# Patient Record
Sex: Male | Born: 1945 | Race: White | Hispanic: No | Marital: Married | Smoking: Current every day smoker
Health system: Southern US, Community
[De-identification: ages and names within clinical notes are randomized; demographics above are authoritative.]

## PROBLEM LIST (undated history)

## (undated) DIAGNOSIS — I10 Essential (primary) hypertension: Secondary | ICD-10-CM

## (undated) DIAGNOSIS — C221 Intrahepatic bile duct carcinoma: Principal | ICD-10-CM

## (undated) DIAGNOSIS — K7460 Unspecified cirrhosis of liver: Secondary | ICD-10-CM

## (undated) DIAGNOSIS — H269 Unspecified cataract: Secondary | ICD-10-CM

## (undated) DIAGNOSIS — R739 Hyperglycemia, unspecified: Secondary | ICD-10-CM

## (undated) DIAGNOSIS — T7840XA Allergy, unspecified, initial encounter: Secondary | ICD-10-CM

## (undated) DIAGNOSIS — J449 Chronic obstructive pulmonary disease, unspecified: Secondary | ICD-10-CM

## (undated) DIAGNOSIS — IMO0002 Reserved for concepts with insufficient information to code with codable children: Secondary | ICD-10-CM

## (undated) DIAGNOSIS — F1910 Other psychoactive substance abuse, uncomplicated: Secondary | ICD-10-CM

## (undated) HISTORY — DX: Intrahepatic bile duct carcinoma: C22.1

## (undated) HISTORY — PX: TONSILLECTOMY: SUR1361

## (undated) HISTORY — DX: Unspecified cataract: H26.9

## (undated) HISTORY — DX: Hyperglycemia, unspecified: R73.9

## (undated) HISTORY — DX: Unspecified cirrhosis of liver: K74.60

## (undated) HISTORY — DX: Allergy, unspecified, initial encounter: T78.40XA

## (undated) HISTORY — DX: Chronic obstructive pulmonary disease, unspecified: J44.9

## (undated) HISTORY — DX: Essential (primary) hypertension: I10

## (undated) HISTORY — DX: Reserved for concepts with insufficient information to code with codable children: IMO0002

## (undated) HISTORY — DX: Other psychoactive substance abuse, uncomplicated: F19.10

## (undated) HISTORY — PX: VASECTOMY: SHX75

---

## 2012-02-07 DIAGNOSIS — IMO0002 Reserved for concepts with insufficient information to code with codable children: Secondary | ICD-10-CM | POA: Diagnosis not present

## 2012-02-07 DIAGNOSIS — M999 Biomechanical lesion, unspecified: Secondary | ICD-10-CM | POA: Diagnosis not present

## 2012-02-07 DIAGNOSIS — S239XXA Sprain of unspecified parts of thorax, initial encounter: Secondary | ICD-10-CM | POA: Diagnosis not present

## 2012-02-07 DIAGNOSIS — S338XXA Sprain of other parts of lumbar spine and pelvis, initial encounter: Secondary | ICD-10-CM | POA: Diagnosis not present

## 2012-02-13 DIAGNOSIS — M999 Biomechanical lesion, unspecified: Secondary | ICD-10-CM | POA: Diagnosis not present

## 2012-02-13 DIAGNOSIS — IMO0002 Reserved for concepts with insufficient information to code with codable children: Secondary | ICD-10-CM | POA: Diagnosis not present

## 2012-02-13 DIAGNOSIS — S239XXA Sprain of unspecified parts of thorax, initial encounter: Secondary | ICD-10-CM | POA: Diagnosis not present

## 2012-02-13 DIAGNOSIS — S338XXA Sprain of other parts of lumbar spine and pelvis, initial encounter: Secondary | ICD-10-CM | POA: Diagnosis not present

## 2012-02-20 DIAGNOSIS — S239XXA Sprain of unspecified parts of thorax, initial encounter: Secondary | ICD-10-CM | POA: Diagnosis not present

## 2012-02-20 DIAGNOSIS — IMO0002 Reserved for concepts with insufficient information to code with codable children: Secondary | ICD-10-CM | POA: Diagnosis not present

## 2012-02-20 DIAGNOSIS — M999 Biomechanical lesion, unspecified: Secondary | ICD-10-CM | POA: Diagnosis not present

## 2012-02-20 DIAGNOSIS — S338XXA Sprain of other parts of lumbar spine and pelvis, initial encounter: Secondary | ICD-10-CM | POA: Diagnosis not present

## 2012-03-05 DIAGNOSIS — I739 Peripheral vascular disease, unspecified: Secondary | ICD-10-CM | POA: Diagnosis not present

## 2012-03-05 DIAGNOSIS — E291 Testicular hypofunction: Secondary | ICD-10-CM | POA: Diagnosis not present

## 2012-03-05 DIAGNOSIS — I10 Essential (primary) hypertension: Secondary | ICD-10-CM | POA: Diagnosis not present

## 2012-03-05 DIAGNOSIS — Z125 Encounter for screening for malignant neoplasm of prostate: Secondary | ICD-10-CM | POA: Diagnosis not present

## 2012-03-05 DIAGNOSIS — Z0000 Encounter for general adult medical examination without abnormal findings: Secondary | ICD-10-CM | POA: Diagnosis not present

## 2012-03-05 DIAGNOSIS — J449 Chronic obstructive pulmonary disease, unspecified: Secondary | ICD-10-CM | POA: Diagnosis not present

## 2012-03-12 DIAGNOSIS — M999 Biomechanical lesion, unspecified: Secondary | ICD-10-CM | POA: Diagnosis not present

## 2012-03-12 DIAGNOSIS — IMO0002 Reserved for concepts with insufficient information to code with codable children: Secondary | ICD-10-CM | POA: Diagnosis not present

## 2012-03-12 DIAGNOSIS — S239XXA Sprain of unspecified parts of thorax, initial encounter: Secondary | ICD-10-CM | POA: Diagnosis not present

## 2012-03-12 DIAGNOSIS — S338XXA Sprain of other parts of lumbar spine and pelvis, initial encounter: Secondary | ICD-10-CM | POA: Diagnosis not present

## 2012-04-09 DIAGNOSIS — M999 Biomechanical lesion, unspecified: Secondary | ICD-10-CM | POA: Diagnosis not present

## 2012-04-09 DIAGNOSIS — S239XXA Sprain of unspecified parts of thorax, initial encounter: Secondary | ICD-10-CM | POA: Diagnosis not present

## 2012-04-09 DIAGNOSIS — S338XXA Sprain of other parts of lumbar spine and pelvis, initial encounter: Secondary | ICD-10-CM | POA: Diagnosis not present

## 2012-04-09 DIAGNOSIS — IMO0002 Reserved for concepts with insufficient information to code with codable children: Secondary | ICD-10-CM | POA: Diagnosis not present

## 2012-05-07 DIAGNOSIS — IMO0002 Reserved for concepts with insufficient information to code with codable children: Secondary | ICD-10-CM | POA: Diagnosis not present

## 2012-05-07 DIAGNOSIS — S338XXA Sprain of other parts of lumbar spine and pelvis, initial encounter: Secondary | ICD-10-CM | POA: Diagnosis not present

## 2012-05-07 DIAGNOSIS — S239XXA Sprain of unspecified parts of thorax, initial encounter: Secondary | ICD-10-CM | POA: Diagnosis not present

## 2012-05-07 DIAGNOSIS — M999 Biomechanical lesion, unspecified: Secondary | ICD-10-CM | POA: Diagnosis not present

## 2012-06-05 DIAGNOSIS — S239XXA Sprain of unspecified parts of thorax, initial encounter: Secondary | ICD-10-CM | POA: Diagnosis not present

## 2012-06-05 DIAGNOSIS — S338XXA Sprain of other parts of lumbar spine and pelvis, initial encounter: Secondary | ICD-10-CM | POA: Diagnosis not present

## 2012-06-05 DIAGNOSIS — IMO0002 Reserved for concepts with insufficient information to code with codable children: Secondary | ICD-10-CM | POA: Diagnosis not present

## 2012-06-05 DIAGNOSIS — M999 Biomechanical lesion, unspecified: Secondary | ICD-10-CM | POA: Diagnosis not present

## 2012-06-27 DIAGNOSIS — Z23 Encounter for immunization: Secondary | ICD-10-CM | POA: Diagnosis not present

## 2012-07-03 DIAGNOSIS — S239XXA Sprain of unspecified parts of thorax, initial encounter: Secondary | ICD-10-CM | POA: Diagnosis not present

## 2012-07-03 DIAGNOSIS — M999 Biomechanical lesion, unspecified: Secondary | ICD-10-CM | POA: Diagnosis not present

## 2012-07-03 DIAGNOSIS — S338XXA Sprain of other parts of lumbar spine and pelvis, initial encounter: Secondary | ICD-10-CM | POA: Diagnosis not present

## 2012-07-03 DIAGNOSIS — IMO0002 Reserved for concepts with insufficient information to code with codable children: Secondary | ICD-10-CM | POA: Diagnosis not present

## 2012-08-14 DIAGNOSIS — IMO0002 Reserved for concepts with insufficient information to code with codable children: Secondary | ICD-10-CM | POA: Diagnosis not present

## 2012-08-14 DIAGNOSIS — M999 Biomechanical lesion, unspecified: Secondary | ICD-10-CM | POA: Diagnosis not present

## 2012-08-14 DIAGNOSIS — S338XXA Sprain of other parts of lumbar spine and pelvis, initial encounter: Secondary | ICD-10-CM | POA: Diagnosis not present

## 2012-08-14 DIAGNOSIS — S239XXA Sprain of unspecified parts of thorax, initial encounter: Secondary | ICD-10-CM | POA: Diagnosis not present

## 2012-08-16 DIAGNOSIS — S239XXA Sprain of unspecified parts of thorax, initial encounter: Secondary | ICD-10-CM | POA: Diagnosis not present

## 2012-08-16 DIAGNOSIS — S338XXA Sprain of other parts of lumbar spine and pelvis, initial encounter: Secondary | ICD-10-CM | POA: Diagnosis not present

## 2012-08-16 DIAGNOSIS — IMO0002 Reserved for concepts with insufficient information to code with codable children: Secondary | ICD-10-CM | POA: Diagnosis not present

## 2012-08-16 DIAGNOSIS — M999 Biomechanical lesion, unspecified: Secondary | ICD-10-CM | POA: Diagnosis not present

## 2012-08-19 DIAGNOSIS — M999 Biomechanical lesion, unspecified: Secondary | ICD-10-CM | POA: Diagnosis not present

## 2012-08-19 DIAGNOSIS — IMO0002 Reserved for concepts with insufficient information to code with codable children: Secondary | ICD-10-CM | POA: Diagnosis not present

## 2012-08-19 DIAGNOSIS — S239XXA Sprain of unspecified parts of thorax, initial encounter: Secondary | ICD-10-CM | POA: Diagnosis not present

## 2012-08-19 DIAGNOSIS — S338XXA Sprain of other parts of lumbar spine and pelvis, initial encounter: Secondary | ICD-10-CM | POA: Diagnosis not present

## 2012-08-21 DIAGNOSIS — S239XXA Sprain of unspecified parts of thorax, initial encounter: Secondary | ICD-10-CM | POA: Diagnosis not present

## 2012-08-21 DIAGNOSIS — IMO0002 Reserved for concepts with insufficient information to code with codable children: Secondary | ICD-10-CM | POA: Diagnosis not present

## 2012-08-21 DIAGNOSIS — M999 Biomechanical lesion, unspecified: Secondary | ICD-10-CM | POA: Diagnosis not present

## 2012-08-21 DIAGNOSIS — S338XXA Sprain of other parts of lumbar spine and pelvis, initial encounter: Secondary | ICD-10-CM | POA: Diagnosis not present

## 2012-08-22 DIAGNOSIS — M999 Biomechanical lesion, unspecified: Secondary | ICD-10-CM | POA: Diagnosis not present

## 2012-08-22 DIAGNOSIS — IMO0002 Reserved for concepts with insufficient information to code with codable children: Secondary | ICD-10-CM | POA: Diagnosis not present

## 2012-08-22 DIAGNOSIS — S239XXA Sprain of unspecified parts of thorax, initial encounter: Secondary | ICD-10-CM | POA: Diagnosis not present

## 2012-08-22 DIAGNOSIS — S338XXA Sprain of other parts of lumbar spine and pelvis, initial encounter: Secondary | ICD-10-CM | POA: Diagnosis not present

## 2012-08-25 DIAGNOSIS — J019 Acute sinusitis, unspecified: Secondary | ICD-10-CM | POA: Diagnosis not present

## 2012-08-26 DIAGNOSIS — M999 Biomechanical lesion, unspecified: Secondary | ICD-10-CM | POA: Diagnosis not present

## 2012-08-26 DIAGNOSIS — S239XXA Sprain of unspecified parts of thorax, initial encounter: Secondary | ICD-10-CM | POA: Diagnosis not present

## 2012-08-26 DIAGNOSIS — IMO0002 Reserved for concepts with insufficient information to code with codable children: Secondary | ICD-10-CM | POA: Diagnosis not present

## 2012-08-26 DIAGNOSIS — S338XXA Sprain of other parts of lumbar spine and pelvis, initial encounter: Secondary | ICD-10-CM | POA: Diagnosis not present

## 2012-08-28 DIAGNOSIS — S239XXA Sprain of unspecified parts of thorax, initial encounter: Secondary | ICD-10-CM | POA: Diagnosis not present

## 2012-08-28 DIAGNOSIS — IMO0002 Reserved for concepts with insufficient information to code with codable children: Secondary | ICD-10-CM | POA: Diagnosis not present

## 2012-08-28 DIAGNOSIS — S338XXA Sprain of other parts of lumbar spine and pelvis, initial encounter: Secondary | ICD-10-CM | POA: Diagnosis not present

## 2012-08-28 DIAGNOSIS — M999 Biomechanical lesion, unspecified: Secondary | ICD-10-CM | POA: Diagnosis not present

## 2012-08-29 DIAGNOSIS — IMO0002 Reserved for concepts with insufficient information to code with codable children: Secondary | ICD-10-CM | POA: Diagnosis not present

## 2012-08-29 DIAGNOSIS — M999 Biomechanical lesion, unspecified: Secondary | ICD-10-CM | POA: Diagnosis not present

## 2012-08-29 DIAGNOSIS — S338XXA Sprain of other parts of lumbar spine and pelvis, initial encounter: Secondary | ICD-10-CM | POA: Diagnosis not present

## 2012-08-29 DIAGNOSIS — S239XXA Sprain of unspecified parts of thorax, initial encounter: Secondary | ICD-10-CM | POA: Diagnosis not present

## 2012-09-02 DIAGNOSIS — M999 Biomechanical lesion, unspecified: Secondary | ICD-10-CM | POA: Diagnosis not present

## 2012-09-02 DIAGNOSIS — S239XXA Sprain of unspecified parts of thorax, initial encounter: Secondary | ICD-10-CM | POA: Diagnosis not present

## 2012-09-02 DIAGNOSIS — IMO0002 Reserved for concepts with insufficient information to code with codable children: Secondary | ICD-10-CM | POA: Diagnosis not present

## 2012-09-02 DIAGNOSIS — S338XXA Sprain of other parts of lumbar spine and pelvis, initial encounter: Secondary | ICD-10-CM | POA: Diagnosis not present

## 2012-09-03 DIAGNOSIS — L723 Sebaceous cyst: Secondary | ICD-10-CM | POA: Diagnosis not present

## 2012-09-03 DIAGNOSIS — E7800 Pure hypercholesterolemia, unspecified: Secondary | ICD-10-CM | POA: Diagnosis not present

## 2012-09-03 DIAGNOSIS — I10 Essential (primary) hypertension: Secondary | ICD-10-CM | POA: Diagnosis not present

## 2012-09-03 DIAGNOSIS — J449 Chronic obstructive pulmonary disease, unspecified: Secondary | ICD-10-CM | POA: Diagnosis not present

## 2012-09-04 DIAGNOSIS — IMO0002 Reserved for concepts with insufficient information to code with codable children: Secondary | ICD-10-CM | POA: Diagnosis not present

## 2012-09-04 DIAGNOSIS — S338XXA Sprain of other parts of lumbar spine and pelvis, initial encounter: Secondary | ICD-10-CM | POA: Diagnosis not present

## 2012-09-04 DIAGNOSIS — S239XXA Sprain of unspecified parts of thorax, initial encounter: Secondary | ICD-10-CM | POA: Diagnosis not present

## 2012-09-04 DIAGNOSIS — M999 Biomechanical lesion, unspecified: Secondary | ICD-10-CM | POA: Diagnosis not present

## 2012-09-09 DIAGNOSIS — M999 Biomechanical lesion, unspecified: Secondary | ICD-10-CM | POA: Diagnosis not present

## 2012-09-09 DIAGNOSIS — S338XXA Sprain of other parts of lumbar spine and pelvis, initial encounter: Secondary | ICD-10-CM | POA: Diagnosis not present

## 2012-09-09 DIAGNOSIS — S239XXA Sprain of unspecified parts of thorax, initial encounter: Secondary | ICD-10-CM | POA: Diagnosis not present

## 2012-09-09 DIAGNOSIS — IMO0002 Reserved for concepts with insufficient information to code with codable children: Secondary | ICD-10-CM | POA: Diagnosis not present

## 2012-09-11 ENCOUNTER — Ambulatory Visit: Payer: Self-pay | Admitting: Family Medicine

## 2012-09-11 DIAGNOSIS — M5020 Other cervical disc displacement, unspecified cervical region: Secondary | ICD-10-CM | POA: Diagnosis not present

## 2012-09-11 DIAGNOSIS — M4802 Spinal stenosis, cervical region: Secondary | ICD-10-CM | POA: Diagnosis not present

## 2012-09-11 DIAGNOSIS — M79609 Pain in unspecified limb: Secondary | ICD-10-CM | POA: Diagnosis not present

## 2012-10-07 DIAGNOSIS — M531 Cervicobrachial syndrome: Secondary | ICD-10-CM | POA: Diagnosis not present

## 2012-10-21 DIAGNOSIS — M531 Cervicobrachial syndrome: Secondary | ICD-10-CM | POA: Diagnosis not present

## 2012-12-26 DIAGNOSIS — H2589 Other age-related cataract: Secondary | ICD-10-CM | POA: Diagnosis not present

## 2012-12-30 DIAGNOSIS — J320 Chronic maxillary sinusitis: Secondary | ICD-10-CM | POA: Diagnosis not present

## 2013-01-13 DIAGNOSIS — IMO0002 Reserved for concepts with insufficient information to code with codable children: Secondary | ICD-10-CM | POA: Diagnosis not present

## 2013-01-13 DIAGNOSIS — S338XXA Sprain of other parts of lumbar spine and pelvis, initial encounter: Secondary | ICD-10-CM | POA: Diagnosis not present

## 2013-01-13 DIAGNOSIS — M999 Biomechanical lesion, unspecified: Secondary | ICD-10-CM | POA: Diagnosis not present

## 2013-01-13 DIAGNOSIS — S239XXA Sprain of unspecified parts of thorax, initial encounter: Secondary | ICD-10-CM | POA: Diagnosis not present

## 2013-01-16 DIAGNOSIS — S239XXA Sprain of unspecified parts of thorax, initial encounter: Secondary | ICD-10-CM | POA: Diagnosis not present

## 2013-01-16 DIAGNOSIS — S338XXA Sprain of other parts of lumbar spine and pelvis, initial encounter: Secondary | ICD-10-CM | POA: Diagnosis not present

## 2013-01-16 DIAGNOSIS — IMO0002 Reserved for concepts with insufficient information to code with codable children: Secondary | ICD-10-CM | POA: Diagnosis not present

## 2013-01-16 DIAGNOSIS — M999 Biomechanical lesion, unspecified: Secondary | ICD-10-CM | POA: Diagnosis not present

## 2013-01-20 DIAGNOSIS — S239XXA Sprain of unspecified parts of thorax, initial encounter: Secondary | ICD-10-CM | POA: Diagnosis not present

## 2013-01-20 DIAGNOSIS — M999 Biomechanical lesion, unspecified: Secondary | ICD-10-CM | POA: Diagnosis not present

## 2013-01-20 DIAGNOSIS — S338XXA Sprain of other parts of lumbar spine and pelvis, initial encounter: Secondary | ICD-10-CM | POA: Diagnosis not present

## 2013-01-20 DIAGNOSIS — IMO0002 Reserved for concepts with insufficient information to code with codable children: Secondary | ICD-10-CM | POA: Diagnosis not present

## 2013-02-20 DIAGNOSIS — H903 Sensorineural hearing loss, bilateral: Secondary | ICD-10-CM | POA: Diagnosis not present

## 2013-02-20 DIAGNOSIS — H6120 Impacted cerumen, unspecified ear: Secondary | ICD-10-CM | POA: Diagnosis not present

## 2013-03-04 DIAGNOSIS — I10 Essential (primary) hypertension: Secondary | ICD-10-CM | POA: Diagnosis not present

## 2013-03-04 DIAGNOSIS — R7301 Impaired fasting glucose: Secondary | ICD-10-CM | POA: Diagnosis not present

## 2013-03-31 DIAGNOSIS — I10 Essential (primary) hypertension: Secondary | ICD-10-CM | POA: Diagnosis not present

## 2013-04-30 DIAGNOSIS — Z0000 Encounter for general adult medical examination without abnormal findings: Secondary | ICD-10-CM | POA: Diagnosis not present

## 2013-04-30 DIAGNOSIS — Z125 Encounter for screening for malignant neoplasm of prostate: Secondary | ICD-10-CM | POA: Diagnosis not present

## 2013-04-30 DIAGNOSIS — I10 Essential (primary) hypertension: Secondary | ICD-10-CM | POA: Diagnosis not present

## 2013-04-30 DIAGNOSIS — J449 Chronic obstructive pulmonary disease, unspecified: Secondary | ICD-10-CM | POA: Diagnosis not present

## 2013-05-29 DIAGNOSIS — M62838 Other muscle spasm: Secondary | ICD-10-CM | POA: Diagnosis not present

## 2013-05-29 DIAGNOSIS — M9981 Other biomechanical lesions of cervical region: Secondary | ICD-10-CM | POA: Diagnosis not present

## 2013-05-29 DIAGNOSIS — M5412 Radiculopathy, cervical region: Secondary | ICD-10-CM | POA: Diagnosis not present

## 2013-05-29 DIAGNOSIS — M999 Biomechanical lesion, unspecified: Secondary | ICD-10-CM | POA: Diagnosis not present

## 2013-09-11 DIAGNOSIS — M999 Biomechanical lesion, unspecified: Secondary | ICD-10-CM | POA: Diagnosis not present

## 2013-09-11 DIAGNOSIS — M9981 Other biomechanical lesions of cervical region: Secondary | ICD-10-CM | POA: Diagnosis not present

## 2013-09-11 DIAGNOSIS — M5412 Radiculopathy, cervical region: Secondary | ICD-10-CM | POA: Diagnosis not present

## 2013-09-11 DIAGNOSIS — M62838 Other muscle spasm: Secondary | ICD-10-CM | POA: Diagnosis not present

## 2013-09-30 DIAGNOSIS — I10 Essential (primary) hypertension: Secondary | ICD-10-CM | POA: Diagnosis not present

## 2013-09-30 DIAGNOSIS — E119 Type 2 diabetes mellitus without complications: Secondary | ICD-10-CM | POA: Diagnosis not present

## 2013-09-30 DIAGNOSIS — L723 Sebaceous cyst: Secondary | ICD-10-CM | POA: Diagnosis not present

## 2013-09-30 DIAGNOSIS — R7309 Other abnormal glucose: Secondary | ICD-10-CM | POA: Diagnosis not present

## 2013-11-11 DIAGNOSIS — J40 Bronchitis, not specified as acute or chronic: Secondary | ICD-10-CM | POA: Diagnosis not present

## 2013-11-20 DIAGNOSIS — M999 Biomechanical lesion, unspecified: Secondary | ICD-10-CM | POA: Diagnosis not present

## 2013-11-20 DIAGNOSIS — M62838 Other muscle spasm: Secondary | ICD-10-CM | POA: Diagnosis not present

## 2013-11-20 DIAGNOSIS — M9981 Other biomechanical lesions of cervical region: Secondary | ICD-10-CM | POA: Diagnosis not present

## 2013-11-20 DIAGNOSIS — M5412 Radiculopathy, cervical region: Secondary | ICD-10-CM | POA: Diagnosis not present

## 2014-03-16 DIAGNOSIS — M9981 Other biomechanical lesions of cervical region: Secondary | ICD-10-CM | POA: Diagnosis not present

## 2014-03-16 DIAGNOSIS — M62838 Other muscle spasm: Secondary | ICD-10-CM | POA: Diagnosis not present

## 2014-03-16 DIAGNOSIS — M5412 Radiculopathy, cervical region: Secondary | ICD-10-CM | POA: Diagnosis not present

## 2014-03-16 DIAGNOSIS — M999 Biomechanical lesion, unspecified: Secondary | ICD-10-CM | POA: Diagnosis not present

## 2014-05-07 DIAGNOSIS — J449 Chronic obstructive pulmonary disease, unspecified: Secondary | ICD-10-CM | POA: Diagnosis not present

## 2014-05-07 DIAGNOSIS — Z125 Encounter for screening for malignant neoplasm of prostate: Secondary | ICD-10-CM | POA: Diagnosis not present

## 2014-05-07 DIAGNOSIS — R7309 Other abnormal glucose: Secondary | ICD-10-CM | POA: Diagnosis not present

## 2014-05-07 DIAGNOSIS — Z23 Encounter for immunization: Secondary | ICD-10-CM | POA: Diagnosis not present

## 2014-05-07 DIAGNOSIS — Z0000 Encounter for general adult medical examination without abnormal findings: Secondary | ICD-10-CM | POA: Diagnosis not present

## 2014-05-07 DIAGNOSIS — I10 Essential (primary) hypertension: Secondary | ICD-10-CM | POA: Diagnosis not present

## 2014-05-07 DIAGNOSIS — J309 Allergic rhinitis, unspecified: Secondary | ICD-10-CM | POA: Diagnosis not present

## 2014-05-18 DIAGNOSIS — H3552 Pigmentary retinal dystrophy: Secondary | ICD-10-CM | POA: Diagnosis not present

## 2014-05-18 DIAGNOSIS — H2510 Age-related nuclear cataract, unspecified eye: Secondary | ICD-10-CM | POA: Diagnosis not present

## 2014-05-25 DIAGNOSIS — M9981 Other biomechanical lesions of cervical region: Secondary | ICD-10-CM | POA: Diagnosis not present

## 2014-05-25 DIAGNOSIS — M5412 Radiculopathy, cervical region: Secondary | ICD-10-CM | POA: Diagnosis not present

## 2014-05-25 DIAGNOSIS — M999 Biomechanical lesion, unspecified: Secondary | ICD-10-CM | POA: Diagnosis not present

## 2014-05-25 DIAGNOSIS — M62838 Other muscle spasm: Secondary | ICD-10-CM | POA: Diagnosis not present

## 2014-06-01 DIAGNOSIS — R7989 Other specified abnormal findings of blood chemistry: Secondary | ICD-10-CM | POA: Diagnosis not present

## 2014-08-13 DIAGNOSIS — M62830 Muscle spasm of back: Secondary | ICD-10-CM | POA: Diagnosis not present

## 2014-08-13 DIAGNOSIS — M9901 Segmental and somatic dysfunction of cervical region: Secondary | ICD-10-CM | POA: Diagnosis not present

## 2014-08-13 DIAGNOSIS — M5412 Radiculopathy, cervical region: Secondary | ICD-10-CM | POA: Diagnosis not present

## 2014-08-13 DIAGNOSIS — M9902 Segmental and somatic dysfunction of thoracic region: Secondary | ICD-10-CM | POA: Diagnosis not present

## 2014-12-03 DIAGNOSIS — M9901 Segmental and somatic dysfunction of cervical region: Secondary | ICD-10-CM | POA: Diagnosis not present

## 2014-12-03 DIAGNOSIS — M5412 Radiculopathy, cervical region: Secondary | ICD-10-CM | POA: Diagnosis not present

## 2014-12-03 DIAGNOSIS — M62830 Muscle spasm of back: Secondary | ICD-10-CM | POA: Diagnosis not present

## 2014-12-03 DIAGNOSIS — M9902 Segmental and somatic dysfunction of thoracic region: Secondary | ICD-10-CM | POA: Diagnosis not present

## 2015-03-19 DIAGNOSIS — M5412 Radiculopathy, cervical region: Secondary | ICD-10-CM | POA: Diagnosis not present

## 2015-03-19 DIAGNOSIS — M9902 Segmental and somatic dysfunction of thoracic region: Secondary | ICD-10-CM | POA: Diagnosis not present

## 2015-03-19 DIAGNOSIS — M9901 Segmental and somatic dysfunction of cervical region: Secondary | ICD-10-CM | POA: Diagnosis not present

## 2015-03-19 DIAGNOSIS — M62830 Muscle spasm of back: Secondary | ICD-10-CM | POA: Diagnosis not present

## 2015-05-10 ENCOUNTER — Encounter: Payer: Self-pay | Admitting: Unknown Physician Specialty

## 2015-05-31 DIAGNOSIS — I10 Essential (primary) hypertension: Secondary | ICD-10-CM | POA: Insufficient documentation

## 2015-05-31 DIAGNOSIS — J449 Chronic obstructive pulmonary disease, unspecified: Secondary | ICD-10-CM | POA: Insufficient documentation

## 2015-06-01 ENCOUNTER — Encounter: Payer: Self-pay | Admitting: Family Medicine

## 2015-06-01 ENCOUNTER — Ambulatory Visit (INDEPENDENT_AMBULATORY_CARE_PROVIDER_SITE_OTHER): Payer: Medicare Other | Admitting: Family Medicine

## 2015-06-01 VITALS — BP 138/71 | HR 69 | Temp 98.9°F | Ht 68.3 in | Wt 194.0 lb

## 2015-06-01 DIAGNOSIS — R748 Abnormal levels of other serum enzymes: Secondary | ICD-10-CM | POA: Diagnosis not present

## 2015-06-01 DIAGNOSIS — I10 Essential (primary) hypertension: Secondary | ICD-10-CM

## 2015-06-01 DIAGNOSIS — Z0000 Encounter for general adult medical examination without abnormal findings: Secondary | ICD-10-CM

## 2015-06-01 DIAGNOSIS — J449 Chronic obstructive pulmonary disease, unspecified: Secondary | ICD-10-CM | POA: Diagnosis not present

## 2015-06-01 LAB — URINALYSIS, ROUTINE W REFLEX MICROSCOPIC
Bilirubin, UA: NEGATIVE
Glucose, UA: NEGATIVE
KETONES UA: NEGATIVE
Leukocytes, UA: NEGATIVE
NITRITE UA: NEGATIVE
Protein, UA: NEGATIVE
RBC UA: NEGATIVE
SPEC GRAV UA: 1.015 (ref 1.005–1.030)
UUROB: 1 mg/dL (ref 0.2–1.0)
pH, UA: 7 (ref 5.0–7.5)

## 2015-06-01 MED ORDER — HYDROCHLOROTHIAZIDE 25 MG PO TABS: 25.0000 mg | ORAL_TABLET | Freq: Every day | ORAL | Status: DC

## 2015-06-01 MED ORDER — AMLODIPINE BESYLATE 10 MG PO TABS
10.0000 mg | ORAL_TABLET | Freq: Every day | ORAL | Status: DC
Start: 2015-06-01 — End: 2016-06-17

## 2015-06-01 MED ORDER — TIOTROPIUM BROMIDE MONOHYDRATE 18 MCG IN CAPS: 18.0000 ug | ORAL_CAPSULE | Freq: Every day | RESPIRATORY_TRACT | Status: DC

## 2015-06-01 MED ORDER — CLONIDINE HCL 0.1 MG PO TABS: 0.1000 mg | ORAL_TABLET | Freq: Two times a day (BID) | ORAL | Status: DC

## 2015-06-01 MED ORDER — LOSARTAN POTASSIUM 100 MG PO TABS: 100.0000 mg | ORAL_TABLET | Freq: Every day | ORAL | Status: DC

## 2015-06-01 MED ORDER — SILDENAFIL CITRATE 100 MG PO TABS: 100.0000 mg | ORAL_TABLET | Freq: Every day | ORAL | Status: DC | PRN

## 2015-06-01 MED ORDER — CARVEDILOL 12.5 MG PO TABS: 12.5000 mg | ORAL_TABLET | Freq: Two times a day (BID) | ORAL | Status: DC

## 2015-06-01 NOTE — Assessment & Plan Note (Signed)
The current medical regimen is effective;  continue present plan and medications.  

## 2015-06-01 NOTE — Progress Notes (Signed)
BP 138/71 mmHg  Pulse 69  Temp(Src) 98.9 F (37.2 C)  Ht 5' 8.3" (1.735 m)  Wt 194 lb (87.998 kg)  BMI 29.23 kg/m2  SpO2 93%   Subjective:    Patient ID: Richard Whitehead, male    DOB: 09/28/1946, 69 y.o.   MRN: [id number]  HPI: Richard Whitehead is a 69 y.o. male  Chief Complaint  Patient presents with  . Annual Exam  . Foot Lesion   patient doing well on that lesion with small corn on his left bunionette area corn is been present at least a year or more Breathing doing okay concerned about cost of Spiriva Blood pressure doing well taking medications without side effects or problems. Having good control Change in medication from last visit is saving 100s of dollars.   Relevant past medical, surgical, family and social history reviewed and updated as indicated. Interim medical history since our last visit reviewed. Allergies and medications reviewed and updated.  Review of Systems  Constitutional: Negative.   HENT: Negative.   Eyes: Negative.   Respiratory: Negative.   Cardiovascular: Negative.   Gastrointestinal:       Occasional dysphasia drinking water clears it up or coughing Not getting worse.   Endocrine: Negative.   Musculoskeletal: Negative.   Skin: Negative.   Allergic/Immunologic: Negative.   Neurological: Negative.   Hematological: Negative.   Psychiatric/Behavioral: Negative.     Per HPI unless specifically indicated above     Objective:    BP 138/71 mmHg  Pulse 69  Temp(Src) 98.9 F (37.2 C)  Ht 5' 8.3" (1.735 m)  Wt 194 lb (87.998 kg)  BMI 29.23 kg/m2  SpO2 93%  Wt Readings from Last 3 Encounters:  06/01/15 194 lb (87.998 kg)  05/07/14 200 lb (90.719 kg)    Physical Exam  Constitutional: He is oriented to person, place, and time. He appears well-developed and well-nourished.  HENT:  Head: Normocephalic and atraumatic.  Right Ear: External ear normal.  Left Ear: External ear normal.  Eyes: Conjunctivae and EOM are normal. Pupils are  equal, round, and reactive to light.  Neck: Normal range of motion. Neck supple.  Cardiovascular: Normal rate, regular rhythm, normal heart sounds and intact distal pulses.   Pulmonary/Chest: Effort normal and breath sounds normal.  Abdominal: Soft. Bowel sounds are normal. There is no splenomegaly or hepatomegaly.  Genitourinary: Rectum normal, prostate normal and penis normal.  Musculoskeletal: Normal range of motion.  Corn on left foot pared with #15 blade down to normal skin 2cm size  Neurological: He is alert and oriented to person, place, and time. He has normal reflexes.  Skin: No rash noted. No erythema.  Psychiatric: He has a normal mood and affect. His behavior is normal. Judgment and thought content normal.    No results found for this or any previous visit.    Assessment & Plan:   Problem List Items Addressed This Visit      Cardiovascular and Mediastinum   Hypertension - Primary    The current medical regimen is effective;  continue present plan and medications.       Relevant Medications   amLODipine (NORVASC) 10 MG tablet   carvedilol (COREG) 12.5 MG tablet   cloNIDine (CATAPRES) 0.1 MG tablet   hydrochlorothiazide (HYDRODIURIL) 25 MG tablet   losartan (COZAAR) 100 MG tablet   sildenafil (VIAGRA) 100 MG tablet     Respiratory   COPD (chronic obstructive pulmonary disease)    The current medical regimen  is effective;  continue present plan and medications.       Relevant Medications   tiotropium (SPIRIVA) 18 MCG inhalation capsule    Other Visit Diagnoses    PE (physical exam), annual        Relevant Orders    Comprehensive metabolic panel    Lipid panel    CBC with Differential/Platelet    TSH    Urinalysis, Routine w reflex microscopic (not at [facility])    PSA        Follow up plan: Return in about 6 months (around 12/02/2015), or if symptoms worsen or fail to improve.

## 2015-06-02 DIAGNOSIS — R748 Abnormal levels of other serum enzymes: Secondary | ICD-10-CM | POA: Insufficient documentation

## 2015-06-02 LAB — CBC WITH DIFFERENTIAL/PLATELET
BASOS ABS: 0 10*3/uL (ref 0.0–0.2)
Basos: 0 %
EOS (ABSOLUTE): 0 10*3/uL (ref 0.0–0.4)
EOS: 1 %
HEMOGLOBIN: 17.4 g/dL (ref 12.6–17.7)
Hematocrit: 49.1 % (ref 37.5–51.0)
IMMATURE GRANS (ABS): 0 10*3/uL (ref 0.0–0.1)
Immature Granulocytes: 0 %
LYMPHS: 14 %
Lymphocytes Absolute: 1.1 10*3/uL (ref 0.7–3.1)
MCH: 36.2 pg — ABNORMAL HIGH (ref 26.6–33.0)
MCHC: 35.4 g/dL (ref 31.5–35.7)
MCV: 102 fL — ABNORMAL HIGH (ref 79–97)
MONOCYTES: 10 %
Monocytes Absolute: 0.8 10*3/uL (ref 0.1–0.9)
NEUTROS ABS: 5.7 10*3/uL (ref 1.4–7.0)
Neutrophils: 75 %
Platelets: 183 10*3/uL (ref 150–379)
RBC: 4.81 x10E6/uL (ref 4.14–5.80)
RDW: 12.8 % (ref 12.3–15.4)
WBC: 7.7 10*3/uL (ref 3.4–10.8)

## 2015-06-02 LAB — COMPREHENSIVE METABOLIC PANEL
A/G RATIO: 1 — AB (ref 1.1–2.5)
ALT: 39 IU/L (ref 0–44)
AST: 37 IU/L (ref 0–40)
Albumin: 3.7 g/dL (ref 3.5–4.8)
Alkaline Phosphatase: 193 IU/L — ABNORMAL HIGH (ref 39–117)
BILIRUBIN TOTAL: 1.4 mg/dL — AB (ref 0.0–1.2)
BUN/Creatinine Ratio: 14 (ref 10–22)
BUN: 11 mg/dL (ref 8–27)
CALCIUM: 10 mg/dL (ref 8.6–10.2)
CHLORIDE: 92 mmol/L — AB (ref 97–108)
CO2: 26 mmol/L (ref 18–29)
Creatinine, Ser: 0.81 mg/dL (ref 0.76–1.27)
GFR calc Af Amer: 103 mL/min/{1.73_m2} (ref >59)
GFR, EST NON AFRICAN AMERICAN: 89 mL/min/{1.73_m2} (ref >59)
GLOBULIN, TOTAL: 3.6 g/dL (ref 1.5–4.5)
Glucose: 133 mg/dL — ABNORMAL HIGH (ref 65–99)
POTASSIUM: 3.9 mmol/L (ref 3.5–5.2)
SODIUM: 133 mmol/L — AB (ref 134–144)
Total Protein: 7.3 g/dL (ref 6.0–8.5)

## 2015-06-02 LAB — LIPID PANEL
CHOL/HDL RATIO: 4 ratio (ref 0.0–5.0)
Cholesterol, Total: 190 mg/dL (ref 100–199)
HDL: 48 mg/dL (ref >39)
LDL Calculated: 122 mg/dL — ABNORMAL HIGH (ref 0–99)
Triglycerides: 99 mg/dL (ref 0–149)
VLDL Cholesterol Cal: 20 mg/dL (ref 5–40)

## 2015-06-02 LAB — TSH: TSH: 1.83 u[IU]/mL (ref 0.450–4.500)

## 2015-06-02 LAB — PSA: PROSTATE SPECIFIC AG, SERUM: 1.7 ng/mL (ref 0.0–4.0)

## 2015-06-02 NOTE — Addendum Note (Signed)
Addended byGolden Pop on: 06/02/2015 11:25 AM   Modules accepted: Orders, SmartSet

## 2015-06-02 NOTE — Assessment & Plan Note (Signed)
Discuss lab work showing elevated alkaline phosphatase patient drinking beer on a regular basis Will cut back on alcohol recheck CMP 1 month

## 2015-07-01 DIAGNOSIS — Z23 Encounter for immunization: Secondary | ICD-10-CM | POA: Diagnosis not present

## 2015-07-21 ENCOUNTER — Telehealth: Payer: Self-pay | Admitting: Family Medicine

## 2015-07-21 NOTE — Telephone Encounter (Signed)
Pt came in stated he would like his RX changed to generic version of Viagra (extra strength). Please call pt ASAP. Thanks

## 2015-07-22 MED ORDER — SILDENAFIL CITRATE 20 MG PO TABS: 20.0000 mg | ORAL_TABLET | Freq: Every day | ORAL | Status: DC | PRN

## 2015-07-22 NOTE — Telephone Encounter (Signed)
Viagra Rx with coupon is too expensive Patient would like an Rx for the generic (any strength)  He will pick up Rx tomorrow

## 2015-07-22 NOTE — Telephone Encounter (Signed)
rx

## 2015-09-24 DIAGNOSIS — J019 Acute sinusitis, unspecified: Secondary | ICD-10-CM | POA: Diagnosis not present

## 2015-11-17 DIAGNOSIS — H2513 Age-related nuclear cataract, bilateral: Secondary | ICD-10-CM | POA: Diagnosis not present

## 2015-12-02 ENCOUNTER — Ambulatory Visit (INDEPENDENT_AMBULATORY_CARE_PROVIDER_SITE_OTHER): Payer: Medicare Other | Admitting: Family Medicine

## 2015-12-02 ENCOUNTER — Encounter: Payer: Self-pay | Admitting: Family Medicine

## 2015-12-02 VITALS — BP 136/79 | HR 66 | Temp 97.6°F | Ht 68.1 in | Wt 193.0 lb

## 2015-12-02 DIAGNOSIS — Z113 Encounter for screening for infections with a predominantly sexual mode of transmission: Secondary | ICD-10-CM | POA: Diagnosis not present

## 2015-12-02 DIAGNOSIS — J449 Chronic obstructive pulmonary disease, unspecified: Secondary | ICD-10-CM

## 2015-12-02 DIAGNOSIS — I10 Essential (primary) hypertension: Secondary | ICD-10-CM

## 2015-12-02 NOTE — Assessment & Plan Note (Signed)
The current medical regimen is effective;  continue present plan and medications.  

## 2015-12-02 NOTE — Progress Notes (Signed)
BP 136/79 mmHg  Pulse 66  Temp(Src) 97.6 F (36.4 C)  Ht 5' 8.1" (1.73 m)  Wt 193 lb (87.544 kg)  BMI 29.25 kg/m2  SpO2 98%   Subjective:    Patient ID: Richard Whitehead, male    DOB: 1946-02-03, 71 y.o.   MRN: [id number]  HPI: Richard Whitehead is a 70 y.o. male  Chief Complaint  Patient presents with  . Hypertension  . COPD   Well with blood pressure good control at home no side effects from medications that may be some ankle edema that comes on during the day goes away overnight No real issues with edema  Breathing is stable continues to smoke but not as much continue Spiriva without problems  Relevant past medical, surgical, family and social history reviewed and updated as indicated. Interim medical history since our last visit reviewed. Allergies and medications reviewed and updated.  Review of Systems  Constitutional: Negative.   Respiratory: Negative.   Cardiovascular: Negative.     Per HPI unless specifically indicated above     Objective:    BP 136/79 mmHg  Pulse 66  Temp(Src) 97.6 F (36.4 C)  Ht 5' 8.1" (1.73 m)  Wt 193 lb (87.544 kg)  BMI 29.25 kg/m2  SpO2 98%  Wt Readings from Last 3 Encounters:  12/02/15 193 lb (87.544 kg)  06/01/15 194 lb (87.998 kg)  05/07/14 200 lb (90.719 kg)    Physical Exam  Constitutional: He is oriented to person, place, and time. He appears well-developed and well-nourished. No distress.  HENT:  Head: Normocephalic and atraumatic.  Right Ear: Hearing normal.  Left Ear: Hearing normal.  Nose: Nose normal.  Eyes: Conjunctivae and lids are normal. Right eye exhibits no discharge. Left eye exhibits no discharge. No scleral icterus.  Cardiovascular: Normal rate, regular rhythm and normal heart sounds.   Pulmonary/Chest: Effort normal and breath sounds normal. No respiratory distress.  Musculoskeletal: Normal range of motion.  Neurological: He is alert and oriented to person, place, and time.  Skin: Skin is intact. No  rash noted.  Psychiatric: He has a normal mood and affect. His speech is normal and behavior is normal. Judgment and thought content normal. Cognition and memory are normal.    Results for orders placed or performed in visit on 06/01/15  Comprehensive metabolic panel  Result Value Ref Range   Glucose 133 (H) 65 - 99 mg/dL   BUN 11 8 - 27 mg/dL   Creatinine, Ser 0.81 0.76 - 1.27 mg/dL   GFR calc non Af Amer 89 >59 mL/min/1.73   GFR calc Af Amer 103 >59 mL/min/1.73   BUN/Creatinine Ratio 14 10 - 22   Sodium 133 (L) 134 - 144 mmol/L   Potassium 3.9 3.5 - 5.2 mmol/L   Chloride 92 (L) 97 - 108 mmol/L   CO2 26 18 - 29 mmol/L   Calcium 10.0 8.6 - 10.2 mg/dL   Total Protein 7.3 6.0 - 8.5 g/dL   Albumin 3.7 3.5 - 4.8 g/dL   Globulin, Total 3.6 1.5 - 4.5 g/dL   Albumin/Globulin Ratio 1.0 (L) 1.1 - 2.5   Bilirubin Total 1.4 (H) 0.0 - 1.2 mg/dL   Alkaline Phosphatase 193 (H) 39 - 117 IU/L   AST 37 0 - 40 IU/L   ALT 39 0 - 44 IU/L  Lipid panel  Result Value Ref Range   Cholesterol, Total 190 100 - 199 mg/dL   Triglycerides 99 0 - 149 mg/dL   HDL  48 >39 mg/dL   VLDL Cholesterol Cal 20 5 - 40 mg/dL   LDL Calculated 122 (H) 0 - 99 mg/dL   Chol/HDL Ratio 4.0 0.0 - 5.0 ratio units  CBC with Differential/Platelet  Result Value Ref Range   WBC 7.7 3.4 - 10.8 x10E3/uL   RBC 4.81 4.14 - 5.80 x10E6/uL   Hemoglobin 17.4 12.6 - 17.7 g/dL   Hematocrit 49.1 37.5 - 51.0 %   MCV 102 (H) 79 - 97 fL   MCH 36.2 (H) 26.6 - 33.0 pg   MCHC 35.4 31.5 - 35.7 g/dL   RDW 12.8 12.3 - 15.4 %   Platelets 183 150 - 379 x10E3/uL   Neutrophils 75 %   Lymphs 14 %   Monocytes 10 %   Eos 1 %   Basos 0 %   Neutrophils Absolute 5.7 1.4 - 7.0 x10E3/uL   Lymphocytes Absolute 1.1 0.7 - 3.1 x10E3/uL   Monocytes Absolute 0.8 0.1 - 0.9 x10E3/uL   EOS (ABSOLUTE) 0.0 0.0 - 0.4 x10E3/uL   Basophils Absolute 0.0 0.0 - 0.2 x10E3/uL   Immature Granulocytes 0 %   Immature Grans (Abs) 0.0 0.0 - 0.1 x10E3/uL  TSH  Result  Value Ref Range   TSH 1.830 0.450 - 4.500 uIU/mL  Urinalysis, Routine w reflex microscopic (not at [facility])  Result Value Ref Range   Specific Gravity, UA 1.015 1.005 - 1.030   pH, UA 7.0 5.0 - 7.5   Color, UA Yellow Yellow   Appearance Ur Clear Clear   Leukocytes, UA Negative Negative   Protein, UA Negative Negative/Trace   Glucose, UA Negative Negative   Ketones, UA Negative Negative   RBC, UA Negative Negative   Bilirubin, UA Negative Negative   Urobilinogen, Ur 1.0 0.2 - 1.0 mg/dL   Nitrite, UA Negative Negative  PSA  Result Value Ref Range   Prostate Specific Ag, Serum 1.7 0.0 - 4.0 ng/mL      Assessment & Plan:   Problem List Items Addressed This Visit      Cardiovascular and Mediastinum   Hypertension    The current medical regimen is effective;  continue present plan and medications.         Respiratory   COPD (chronic obstructive pulmonary disease) (HCC)    The current medical regimen is effective;  continue present plan and medications.        Other Visit Diagnoses    Routine screening for STI (sexually transmitted infection)    -  Primary    Relevant Orders    Hepatitis C Antibody        Follow up plan: Return in about 6 months (around 05/31/2016) for Physical Exam.

## 2016-01-17 ENCOUNTER — Encounter: Payer: Self-pay | Admitting: Family Medicine

## 2016-01-17 ENCOUNTER — Ambulatory Visit (INDEPENDENT_AMBULATORY_CARE_PROVIDER_SITE_OTHER): Payer: Medicare Other | Admitting: Family Medicine

## 2016-01-17 VITALS — BP 133/79 | HR 70 | Temp 97.5°F | Ht 68.1 in | Wt 189.0 lb

## 2016-01-17 DIAGNOSIS — H6122 Impacted cerumen, left ear: Secondary | ICD-10-CM | POA: Diagnosis not present

## 2016-01-17 NOTE — Patient Instructions (Addendum)
DEBROX- put it in your ear and let it sit there for 15 minutes, then shake it out. Do this 1x per month to prevent getting an over buildup of wax.

## 2016-01-17 NOTE — Progress Notes (Signed)
BP 133/79 mmHg  Pulse 70  Temp(Src) 97.5 F (36.4 C)  Ht 5' 8.1" (1.73 m)  Wt 189 lb (85.73 kg)  BMI 28.64 kg/m2  SpO2 97%   Subjective:    Patient ID: Richard Whitehead, male    DOB: [birth year], 70 y.o.   MRN: [id number]  HPI: Richard Whitehead is a 70 y.o. male  Chief Complaint  Patient presents with  . ear clogged   EAG CLOGGED Duration: chronic Involved ear(s):  left Sensation of feeling clogged/plugged: yes Decreased/muffled hearing:yes Ear pain: no Fever: no Otorrhea: no Hearing loss: yes Upper respiratory infection symptoms: no Using Q-Tips: no Status: stable History of cerumenosis: yes Treatments attempted: tried to get his ears cleaned out by the audiologist, but she didn't have the tools to get all the wax out so recommended that he come here  Relevant past medical, surgical, family and social history reviewed and updated as indicated. Interim medical history since our last visit reviewed. Allergies and medications reviewed and updated.  Review of Systems  Constitutional: Negative.   HENT: Positive for hearing loss. Negative for congestion, dental problem, drooling, ear discharge, ear pain, facial swelling, mouth sores, nosebleeds, postnasal drip, rhinorrhea, sinus pressure, sneezing, sore throat, tinnitus, trouble swallowing and voice change.     Per HPI unless specifically indicated above     Objective:    BP 133/79 mmHg  Pulse 70  Temp(Src) 97.5 F (36.4 C)  Ht 5' 8.1" (1.73 m)  Wt 189 lb (85.73 kg)  BMI 28.64 kg/m2  SpO2 97%  Wt Readings from Last 3 Encounters:  01/17/16 189 lb (85.73 kg)  12/02/15 193 lb (87.544 kg)  06/01/15 194 lb (87.998 kg)    Physical Exam  Constitutional: He is oriented to person, place, and time. He appears well-developed and well-nourished. No distress.  HENT:  Head: Normocephalic and atraumatic.  Right Ear: Hearing, tympanic membrane, external ear and ear canal normal.  Left Ear: Hearing, tympanic membrane and external  ear normal.  Nose: Nose normal.  Mouth/Throat: Oropharynx is clear and moist. No oropharyngeal exudate.  Cerumen impaction deep in L ear, mild amount of dry wax left in L EAC after irrigation. TM clear   Eyes: Conjunctivae and lids are normal. Right eye exhibits no discharge. Left eye exhibits no discharge. No scleral icterus.  Pulmonary/Chest: Effort normal. No respiratory distress.  Musculoskeletal: Normal range of motion.  Neurological: He is alert and oriented to person, place, and time.  Skin: Skin is warm, dry and intact. No rash noted. No erythema. No pallor.  Psychiatric: He has a normal mood and affect. His speech is normal and behavior is normal. Judgment and thought content normal. Cognition and memory are normal.  Nursing note and vitals reviewed.   Results for orders placed or performed in visit on 06/01/15  Comprehensive metabolic panel  Result Value Ref Range   Glucose 133 (H) 65 - 99 mg/dL   BUN 11 8 - 27 mg/dL   Creatinine, Ser 0.81 0.76 - 1.27 mg/dL   GFR calc non Af Amer 89 >59 mL/min/1.73   GFR calc Af Amer 103 >59 mL/min/1.73   BUN/Creatinine Ratio 14 10 - 22   Sodium 133 (L) 134 - 144 mmol/L   Potassium 3.9 3.5 - 5.2 mmol/L   Chloride 92 (L) 97 - 108 mmol/L   CO2 26 18 - 29 mmol/L   Calcium 10.0 8.6 - 10.2 mg/dL   Total Protein 7.3 6.0 - 8.5 g/dL   Albumin 3.7  3.5 - 4.8 g/dL   Globulin, Total 3.6 1.5 - 4.5 g/dL   Albumin/Globulin Ratio 1.0 (L) 1.1 - 2.5   Bilirubin Total 1.4 (H) 0.0 - 1.2 mg/dL   Alkaline Phosphatase 193 (H) 39 - 117 IU/L   AST 37 0 - 40 IU/L   ALT 39 0 - 44 IU/L  Lipid panel  Result Value Ref Range   Cholesterol, Total 190 100 - 199 mg/dL   Triglycerides 99 0 - 149 mg/dL   HDL 48 >39 mg/dL   VLDL Cholesterol Cal 20 5 - 40 mg/dL   LDL Calculated 122 (H) 0 - 99 mg/dL   Chol/HDL Ratio 4.0 0.0 - 5.0 ratio units  CBC with Differential/Platelet  Result Value Ref Range   WBC 7.7 3.4 - 10.8 x10E3/uL   RBC 4.81 4.14 - 5.80 x10E6/uL    Hemoglobin 17.4 12.6 - 17.7 g/dL   Hematocrit 49.1 37.5 - 51.0 %   MCV 102 (H) 79 - 97 fL   MCH 36.2 (H) 26.6 - 33.0 pg   MCHC 35.4 31.5 - 35.7 g/dL   RDW 12.8 12.3 - 15.4 %   Platelets 183 150 - 379 x10E3/uL   Neutrophils 75 %   Lymphs 14 %   Monocytes 10 %   Eos 1 %   Basos 0 %   Neutrophils Absolute 5.7 1.4 - 7.0 x10E3/uL   Lymphocytes Absolute 1.1 0.7 - 3.1 x10E3/uL   Monocytes Absolute 0.8 0.1 - 0.9 x10E3/uL   EOS (ABSOLUTE) 0.0 0.0 - 0.4 x10E3/uL   Basophils Absolute 0.0 0.0 - 0.2 x10E3/uL   Immature Granulocytes 0 %   Immature Grans (Abs) 0.0 0.0 - 0.1 x10E3/uL  TSH  Result Value Ref Range   TSH 1.830 0.450 - 4.500 uIU/mL  Urinalysis, Routine w reflex microscopic (not at [facility])  Result Value Ref Range   Specific Gravity, UA 1.015 1.005 - 1.030   pH, UA 7.0 5.0 - 7.5   Color, UA Yellow Yellow   Appearance Ur Clear Clear   Leukocytes, UA Negative Negative   Protein, UA Negative Negative/Trace   Glucose, UA Negative Negative   Ketones, UA Negative Negative   RBC, UA Negative Negative   Bilirubin, UA Negative Negative   Urobilinogen, Ur 1.0 0.2 - 1.0 mg/dL   Nitrite, UA Negative Negative  PSA  Result Value Ref Range   Prostate Specific Ag, Serum 1.7 0.0 - 4.0 ng/mL      Assessment & Plan:   Problem List Items Addressed This Visit    None    Visit Diagnoses    Cerumen impaction, left    -  Primary    Ear irrigated with good results. Will use debrox monthly to prevent impaction. Call with any concerns.        Follow up plan: Return if symptoms worsen or fail to improve.

## 2016-02-02 DIAGNOSIS — H60332 Swimmer's ear, left ear: Secondary | ICD-10-CM | POA: Diagnosis not present

## 2016-02-02 DIAGNOSIS — H6122 Impacted cerumen, left ear: Secondary | ICD-10-CM | POA: Diagnosis not present

## 2016-02-07 DIAGNOSIS — S80819A Abrasion, unspecified lower leg, initial encounter: Secondary | ICD-10-CM | POA: Diagnosis not present

## 2016-02-14 DIAGNOSIS — H903 Sensorineural hearing loss, bilateral: Secondary | ICD-10-CM | POA: Diagnosis not present

## 2016-02-14 DIAGNOSIS — H60332 Swimmer's ear, left ear: Secondary | ICD-10-CM | POA: Diagnosis not present

## 2016-02-15 ENCOUNTER — Other Ambulatory Visit: Payer: Self-pay | Admitting: Family Medicine

## 2016-02-15 MED ORDER — TIOTROPIUM BROMIDE MONOHYDRATE 1.25 MCG/ACT IN AERS: 1.0000 | INHALATION_SPRAY | Freq: Every morning | RESPIRATORY_TRACT | Status: DC

## 2016-05-11 ENCOUNTER — Encounter: Payer: Self-pay | Admitting: Family Medicine

## 2016-05-11 ENCOUNTER — Ambulatory Visit (INDEPENDENT_AMBULATORY_CARE_PROVIDER_SITE_OTHER): Payer: Medicare Other | Admitting: Family Medicine

## 2016-05-11 VITALS — BP 147/78 | HR 75 | Temp 98.4°F | Ht 68.7 in | Wt 190.0 lb

## 2016-05-11 DIAGNOSIS — H6122 Impacted cerumen, left ear: Secondary | ICD-10-CM | POA: Diagnosis not present

## 2016-05-11 NOTE — Progress Notes (Signed)
BP (!) 147/78 (BP Location: Left Arm, Patient Position: Sitting, Cuff Size: Normal)   Pulse 75   Temp 98.4 F (36.9 C)   Ht 5' 8.7" (1.745 m)   Wt 190 lb (86.2 kg)   SpO2 99%   BMI 28.30 kg/m    Subjective:    Patient ID: Richard Whitehead, male    DOB: 1945/12/05, 70 y.o.   MRN: [id number]  HPI: Richard Whitehead is a 70 y.o. male  Chief Complaint  Patient presents with  . ear cleaning    left   Left ear stopped up and decreased hearing wants wax removed Relevant past medical, surgical, family and social history reviewed and updated as indicated. Interim medical history since our last visit reviewed. Allergies and medications reviewed and updated.  Review of Systems  Per HPI unless specifically indicated above     Objective:    BP (!) 147/78 (BP Location: Left Arm, Patient Position: Sitting, Cuff Size: Normal)   Pulse 75   Temp 98.4 F (36.9 C)   Ht 5' 8.7" (1.745 m)   Wt 190 lb (86.2 kg)   SpO2 99%   BMI 28.30 kg/m   Wt Readings from Last 3 Encounters:  05/11/16 190 lb (86.2 kg)  01/17/16 189 lb (85.7 kg)  12/02/15 193 lb (87.5 kg)    Physical Exam  Constitutional: He is oriented to person, place, and time. He appears well-developed and well-nourished. No distress.  HENT:  Head: Normocephalic and atraumatic.  Right Ear: Hearing and external ear normal.  Left Ear: Hearing normal.  Nose: Nose normal.  Left ear blocked with cerumen removed with water and instruments revealing normal canal and TM restoration of hearing.  Eyes: Conjunctivae and lids are normal. Right eye exhibits no discharge. Left eye exhibits no discharge. No scleral icterus.  Pulmonary/Chest: Effort normal. No respiratory distress.  Musculoskeletal: Normal range of motion.  Neurological: He is alert and oriented to person, place, and time.  Skin: Skin is intact. No rash noted.  Psychiatric: He has a normal mood and affect. His speech is normal and behavior is normal. Judgment and thought content  normal. Cognition and memory are normal.    Results for orders placed or performed in visit on 06/01/15  Comprehensive metabolic panel  Result Value Ref Range   Glucose 133 (H) 65 - 99 mg/dL   BUN 11 8 - 27 mg/dL   Creatinine, Ser 0.81 0.76 - 1.27 mg/dL   GFR calc non Af Amer 89 >59 mL/min/1.73   GFR calc Af Amer 103 >59 mL/min/1.73   BUN/Creatinine Ratio 14 10 - 22   Sodium 133 (L) 134 - 144 mmol/L   Potassium 3.9 3.5 - 5.2 mmol/L   Chloride 92 (L) 97 - 108 mmol/L   CO2 26 18 - 29 mmol/L   Calcium 10.0 8.6 - 10.2 mg/dL   Total Protein 7.3 6.0 - 8.5 g/dL   Albumin 3.7 3.5 - 4.8 g/dL   Globulin, Total 3.6 1.5 - 4.5 g/dL   Albumin/Globulin Ratio 1.0 (L) 1.1 - 2.5   Bilirubin Total 1.4 (H) 0.0 - 1.2 mg/dL   Alkaline Phosphatase 193 (H) 39 - 117 IU/L   AST 37 0 - 40 IU/L   ALT 39 0 - 44 IU/L  Lipid panel  Result Value Ref Range   Cholesterol, Total 190 100 - 199 mg/dL   Triglycerides 99 0 - 149 mg/dL   HDL 48 >39 mg/dL   VLDL Cholesterol Cal 20 5 -  40 mg/dL   LDL Calculated 122 (H) 0 - 99 mg/dL   Chol/HDL Ratio 4.0 0.0 - 5.0 ratio units  CBC with Differential/Platelet  Result Value Ref Range   WBC 7.7 3.4 - 10.8 x10E3/uL   RBC 4.81 4.14 - 5.80 x10E6/uL   Hemoglobin 17.4 12.6 - 17.7 g/dL   Hematocrit 49.1 37.5 - 51.0 %   MCV 102 (H) 79 - 97 fL   MCH 36.2 (H) 26.6 - 33.0 pg   MCHC 35.4 31.5 - 35.7 g/dL   RDW 12.8 12.3 - 15.4 %   Platelets 183 150 - 379 x10E3/uL   Neutrophils 75 %   Lymphs 14 %   Monocytes 10 %   Eos 1 %   Basos 0 %   Neutrophils Absolute 5.7 1.4 - 7.0 x10E3/uL   Lymphocytes Absolute 1.1 0.7 - 3.1 x10E3/uL   Monocytes Absolute 0.8 0.1 - 0.9 x10E3/uL   EOS (ABSOLUTE) 0.0 0.0 - 0.4 x10E3/uL   Basophils Absolute 0.0 0.0 - 0.2 x10E3/uL   Immature Granulocytes 0 %   Immature Grans (Abs) 0.0 0.0 - 0.1 x10E3/uL  TSH  Result Value Ref Range   TSH 1.830 0.450 - 4.500 uIU/mL  Urinalysis, Routine w reflex microscopic (not at [facility])  Result Value Ref Range    Specific Gravity, UA 1.015 1.005 - 1.030   pH, UA 7.0 5.0 - 7.5   Color, UA Yellow Yellow   Appearance Ur Clear Clear   Leukocytes, UA Negative Negative   Protein, UA Negative Negative/Trace   Glucose, UA Negative Negative   Ketones, UA Negative Negative   RBC, UA Negative Negative   Bilirubin, UA Negative Negative   Urobilinogen, Ur 1.0 0.2 - 1.0 mg/dL   Nitrite, UA Negative Negative  PSA  Result Value Ref Range   Prostate Specific Ag, Serum 1.7 0.0 - 4.0 ng/mL      Assessment & Plan:   Problem List Items Addressed This Visit    None    Visit Diagnoses    Cerumen impaction, left    -  Primary       Follow up plan: Return for As scheduled for physical.

## 2016-05-15 ENCOUNTER — Encounter (INDEPENDENT_AMBULATORY_CARE_PROVIDER_SITE_OTHER): Payer: Self-pay

## 2016-05-16 DIAGNOSIS — M62830 Muscle spasm of back: Secondary | ICD-10-CM | POA: Diagnosis not present

## 2016-05-16 DIAGNOSIS — M9901 Segmental and somatic dysfunction of cervical region: Secondary | ICD-10-CM | POA: Diagnosis not present

## 2016-05-16 DIAGNOSIS — M9902 Segmental and somatic dysfunction of thoracic region: Secondary | ICD-10-CM | POA: Diagnosis not present

## 2016-05-16 DIAGNOSIS — M5412 Radiculopathy, cervical region: Secondary | ICD-10-CM | POA: Diagnosis not present

## 2016-05-19 DIAGNOSIS — S80819A Abrasion, unspecified lower leg, initial encounter: Secondary | ICD-10-CM | POA: Diagnosis not present

## 2016-05-20 ENCOUNTER — Other Ambulatory Visit: Payer: Self-pay | Admitting: Family Medicine

## 2016-05-20 DIAGNOSIS — I10 Essential (primary) hypertension: Secondary | ICD-10-CM

## 2016-05-22 DIAGNOSIS — S80819D Abrasion, unspecified lower leg, subsequent encounter: Secondary | ICD-10-CM | POA: Diagnosis not present

## 2016-05-23 DIAGNOSIS — S80819D Abrasion, unspecified lower leg, subsequent encounter: Secondary | ICD-10-CM | POA: Diagnosis not present

## 2016-05-24 DIAGNOSIS — S80819D Abrasion, unspecified lower leg, subsequent encounter: Secondary | ICD-10-CM | POA: Diagnosis not present

## 2016-05-30 DIAGNOSIS — S80819D Abrasion, unspecified lower leg, subsequent encounter: Secondary | ICD-10-CM | POA: Diagnosis not present

## 2016-06-05 DIAGNOSIS — S80819D Abrasion, unspecified lower leg, subsequent encounter: Secondary | ICD-10-CM | POA: Diagnosis not present

## 2016-06-13 DIAGNOSIS — M5412 Radiculopathy, cervical region: Secondary | ICD-10-CM | POA: Diagnosis not present

## 2016-06-13 DIAGNOSIS — M9901 Segmental and somatic dysfunction of cervical region: Secondary | ICD-10-CM | POA: Diagnosis not present

## 2016-06-13 DIAGNOSIS — M62830 Muscle spasm of back: Secondary | ICD-10-CM | POA: Diagnosis not present

## 2016-06-13 DIAGNOSIS — M9902 Segmental and somatic dysfunction of thoracic region: Secondary | ICD-10-CM | POA: Diagnosis not present

## 2016-06-17 ENCOUNTER — Other Ambulatory Visit: Payer: Self-pay | Admitting: Family Medicine

## 2016-06-17 DIAGNOSIS — I10 Essential (primary) hypertension: Secondary | ICD-10-CM

## 2016-06-21 ENCOUNTER — Ambulatory Visit (INDEPENDENT_AMBULATORY_CARE_PROVIDER_SITE_OTHER): Payer: Medicare Other | Admitting: Family Medicine

## 2016-06-21 ENCOUNTER — Encounter: Payer: Self-pay | Admitting: Family Medicine

## 2016-06-21 VITALS — BP 121/72 | HR 73 | Temp 97.4°F | Ht 68.6 in | Wt 194.0 lb

## 2016-06-21 DIAGNOSIS — Z23 Encounter for immunization: Secondary | ICD-10-CM | POA: Diagnosis not present

## 2016-06-21 DIAGNOSIS — I10 Essential (primary) hypertension: Secondary | ICD-10-CM | POA: Diagnosis not present

## 2016-06-21 DIAGNOSIS — Z0000 Encounter for general adult medical examination without abnormal findings: Secondary | ICD-10-CM | POA: Diagnosis not present

## 2016-06-21 DIAGNOSIS — R160 Hepatomegaly, not elsewhere classified: Secondary | ICD-10-CM

## 2016-06-21 DIAGNOSIS — N400 Enlarged prostate without lower urinary tract symptoms: Secondary | ICD-10-CM | POA: Diagnosis not present

## 2016-06-21 DIAGNOSIS — R600 Localized edema: Secondary | ICD-10-CM | POA: Diagnosis not present

## 2016-06-21 LAB — URINALYSIS, ROUTINE W REFLEX MICROSCOPIC
Bilirubin, UA: NEGATIVE
GLUCOSE, UA: NEGATIVE
Ketones, UA: NEGATIVE
LEUKOCYTES UA: NEGATIVE
Nitrite, UA: NEGATIVE
PROTEIN UA: NEGATIVE
Specific Gravity, UA: 1.01 (ref 1.005–1.030)
UUROB: 1 mg/dL (ref 0.2–1.0)
pH, UA: 6 (ref 5.0–7.5)

## 2016-06-21 LAB — MICROSCOPIC EXAMINATION

## 2016-06-21 MED ORDER — MUPIROCIN 2 % EX OINT: 1.0000 "application " | TOPICAL_OINTMENT | Freq: Two times a day (BID) | CUTANEOUS | 1 refills | Status: DC

## 2016-06-21 MED ORDER — LOSARTAN POTASSIUM 100 MG PO TABS: 100.0000 mg | ORAL_TABLET | Freq: Every day | ORAL | 12 refills | Status: DC

## 2016-06-21 MED ORDER — CLONIDINE HCL 0.1 MG PO TABS: 0.1000 mg | ORAL_TABLET | Freq: Two times a day (BID) | ORAL | 12 refills | Status: DC

## 2016-06-21 MED ORDER — CARVEDILOL 12.5 MG PO TABS: 12.5000 mg | ORAL_TABLET | Freq: Two times a day (BID) | ORAL | 12 refills | Status: DC

## 2016-06-21 MED ORDER — SPIRONOLACTONE 25 MG PO TABS: 25.0000 mg | ORAL_TABLET | Freq: Every day | ORAL | 3 refills | Status: DC

## 2016-06-21 NOTE — Assessment & Plan Note (Signed)
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

## 2016-06-21 NOTE — Addendum Note (Signed)
Addended byGolden Pop on: 06/21/2016 11:17 AM   Modules accepted: Orders

## 2016-06-21 NOTE — Progress Notes (Signed)
BP 121/72 (BP Location: Left Arm, Patient Position: Sitting, Cuff Size: Normal)   Pulse 73   Temp 97.4 F (36.3 C)   Ht 5' 8.6" (1.742 m)   Wt 194 lb (88 kg)   SpO2 97%   BMI 28.98 kg/m    Subjective:    Patient ID: Richard Whitehead, male    DOB: [birth year], 70 y.o.   MRN: [id number]  HPI: Richard Whitehead is a 70 y.o. male  Chief Complaint  Patient presents with  . Annual Exam  . Leg Swelling    left, wet   Patient with left leg swelling ongoing about a week or so on has chronic dryness inflammation and venous stasis changes both legs with exacerbation and left leg over the last week with worsening edema is now very red. Leg goes down at night then start swelling during the day. Blood pressure doing well with medications no issues Breathing doing okay. Continues drinking 2- 3-4 beers a day. Relevant past medical, surgical, family and social history reviewed and updated as indicated. Interim medical history since our last visit reviewed. Allergies and medications reviewed and updated.  Review of Systems  Constitutional: Negative.   HENT: Negative.   Eyes: Negative.   Respiratory: Negative.   Cardiovascular: Negative.   Gastrointestinal:       Over the last month had marked dysphagia with food getting stuck in his throat more in his neck. He walks has had a cough drink water to get it up or out or down. Gets real bloated take Tums and Gas-X and some antacid medications has been doing this for out a month.   Endocrine: Negative.   Genitourinary: Negative.   Musculoskeletal: Negative.   Skin: Negative.   Allergic/Immunologic: Negative.   Neurological: Negative.   Hematological: Negative.   Psychiatric/Behavioral: Negative.     Per HPI unless specifically indicated above     Objective:    BP 121/72 (BP Location: Left Arm, Patient Position: Sitting, Cuff Size: Normal)   Pulse 73   Temp 97.4 F (36.3 C)   Ht 5' 8.6" (1.742 m)   Wt 194 lb (88 kg)   SpO2 97%   BMI  28.98 kg/m   Wt Readings from Last 3 Encounters:  06/21/16 194 lb (88 kg)  05/11/16 190 lb (86.2 kg)  01/17/16 189 lb (85.7 kg)    Physical Exam  Constitutional: He is oriented to person, place, and time. He appears well-developed and well-nourished.  HENT:  Head: Normocephalic and atraumatic.  Right Ear: External ear normal.  Left Ear: External ear normal.  Eyes: Conjunctivae and EOM are normal. Pupils are equal, round, and reactive to light.  Neck: Normal range of motion. Neck supple.  Cardiovascular: Normal rate, regular rhythm, normal heart sounds and intact distal pulses.   Pulmonary/Chest: Effort normal and breath sounds normal.  Abdominal: Bowel sounds are normal. He exhibits distension. He exhibits no mass. There is no splenomegaly or hepatomegaly. There is no tenderness. There is no rebound and no guarding.  Massively enlarged liver at least 11 cm with marked distention and changes of ascites.  Genitourinary: Rectum normal, prostate normal and penis normal.  Musculoskeletal: Normal range of motion.  Neurological: He is alert and oriented to person, place, and time. He has normal reflexes.  Skin: No rash noted. No erythema.  Leg with changes of edema on left and cellulitis. Right leg with marked dry skin venous stasis changes telangiectasias on upper abdomen and chest and back With  bruising  Psychiatric: He has a normal mood and affect. His behavior is normal. Judgment and thought content normal.    Results for orders placed or performed in visit on 06/01/15  Comprehensive metabolic panel  Result Value Ref Range   Glucose 133 (H) 65 - 99 mg/dL   BUN 11 8 - 27 mg/dL   Creatinine, Ser 0.81 0.76 - 1.27 mg/dL   GFR calc non Af Amer 89 >59 mL/min/1.73   GFR calc Af Amer 103 >59 mL/min/1.73   BUN/Creatinine Ratio 14 10 - 22   Sodium 133 (L) 134 - 144 mmol/L   Potassium 3.9 3.5 - 5.2 mmol/L   Chloride 92 (L) 97 - 108 mmol/L   CO2 26 18 - 29 mmol/L   Calcium 10.0 8.6 - 10.2  mg/dL   Total Protein 7.3 6.0 - 8.5 g/dL   Albumin 3.7 3.5 - 4.8 g/dL   Globulin, Total 3.6 1.5 - 4.5 g/dL   Albumin/Globulin Ratio 1.0 (L) 1.1 - 2.5   Bilirubin Total 1.4 (H) 0.0 - 1.2 mg/dL   Alkaline Phosphatase 193 (H) 39 - 117 IU/L   AST 37 0 - 40 IU/L   ALT 39 0 - 44 IU/L  Lipid panel  Result Value Ref Range   Cholesterol, Total 190 100 - 199 mg/dL   Triglycerides 99 0 - 149 mg/dL   HDL 48 >39 mg/dL   VLDL Cholesterol Cal 20 5 - 40 mg/dL   LDL Calculated 122 (H) 0 - 99 mg/dL   Chol/HDL Ratio 4.0 0.0 - 5.0 ratio units  CBC with Differential/Platelet  Result Value Ref Range   WBC 7.7 3.4 - 10.8 x10E3/uL   RBC 4.81 4.14 - 5.80 x10E6/uL   Hemoglobin 17.4 12.6 - 17.7 g/dL   Hematocrit 49.1 37.5 - 51.0 %   MCV 102 (H) 79 - 97 fL   MCH 36.2 (H) 26.6 - 33.0 pg   MCHC 35.4 31.5 - 35.7 g/dL   RDW 12.8 12.3 - 15.4 %   Platelets 183 150 - 379 x10E3/uL   Neutrophils 75 %   Lymphs 14 %   Monocytes 10 %   Eos 1 %   Basos 0 %   Neutrophils Absolute 5.7 1.4 - 7.0 x10E3/uL   Lymphocytes Absolute 1.1 0.7 - 3.1 x10E3/uL   Monocytes Absolute 0.8 0.1 - 0.9 x10E3/uL   EOS (ABSOLUTE) 0.0 0.0 - 0.4 x10E3/uL   Basophils Absolute 0.0 0.0 - 0.2 x10E3/uL   Immature Granulocytes 0 %   Immature Grans (Abs) 0.0 0.0 - 0.1 x10E3/uL  TSH  Result Value Ref Range   TSH 1.830 0.450 - 4.500 uIU/mL  Urinalysis, Routine w reflex microscopic (not at [facility])  Result Value Ref Range   Specific Gravity, UA 1.015 1.005 - 1.030   pH, UA 7.0 5.0 - 7.5   Color, UA Yellow Yellow   Appearance Ur Clear Clear   Leukocytes, UA Negative Negative   Protein, UA Negative Negative/Trace   Glucose, UA Negative Negative   Ketones, UA Negative Negative   RBC, UA Negative Negative   Bilirubin, UA Negative Negative   Urobilinogen, Ur 1.0 0.2 - 1.0 mg/dL   Nitrite, UA Negative Negative  PSA  Result Value Ref Range   Prostate Specific Ag, Serum 1.7 0.0 - 4.0 ng/mL      Assessment & Plan:   Problem List Items  Addressed This Visit      Cardiovascular and Mediastinum   Hypertension    With leg edema most  likely secondary to ascites but amlodipine may be aggravating Will discontinue amlodipine For blood pressure will start spironolactone and stop hydrochlorothiazide. Watch blood pressure may need to add more medications But hopefully with decreasing alcohol blood pressure will do better.       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 We'll start spironolactone GI referral urgent Discuss alcohol intake cautions about stopping in DTs       Relevant Medications   spironolactone (ALDACTONE) 25 MG tablet   Other Relevant Orders   Ambulatory referral to Gastroenterology     Other   Bilateral leg edema    Discuss leg edema elevation higher than nose compressions leg care with Vaseline We'll give Bactroban ointment for cellulitis changes legs      Relevant Medications   spironolactone (ALDACTONE) 25 MG tablet   Other Relevant Orders   Ambulatory referral to Gastroenterology    Other Visit Diagnoses    Well adult exam    -  Primary   Relevant Orders   CBC with Differential/Platelet   Comprehensive metabolic panel   Lipid Panel w/o Chol/HDL Ratio   PSA   TSH   Urinalysis, Routine w reflex microscopic (not at [facility])   Enlarged prostate       Relevant Orders   PSA   Immunization due       Relevant Orders   Flu vaccine HIGH DOSE PF (Fluzone High dose) (Completed)       Follow up plan: Return in about 4 weeks (around 07/19/2016), or if symptoms worsen or fail to improve, for Recheck.

## 2016-06-21 NOTE — Assessment & Plan Note (Signed)
Discuss leg edema elevation higher than nose compressions leg care with Vaseline We'll give Bactroban ointment for cellulitis changes legs

## 2016-06-21 NOTE — Assessment & Plan Note (Addendum)
With leg edema most likely secondary to ascites but amlodipine may be aggravating Will discontinue amlodipine For blood pressure will start spironolactone and stop hydrochlorothiazide. Watch blood pressure may need to add more medications But hopefully with decreasing alcohol blood pressure will do better.

## 2016-06-22 ENCOUNTER — Ambulatory Visit (INDEPENDENT_AMBULATORY_CARE_PROVIDER_SITE_OTHER): Payer: Medicare Other | Admitting: Gastroenterology

## 2016-06-22 ENCOUNTER — Other Ambulatory Visit (INDEPENDENT_AMBULATORY_CARE_PROVIDER_SITE_OTHER): Payer: Medicare Other

## 2016-06-22 ENCOUNTER — Encounter: Payer: Self-pay | Admitting: Family Medicine

## 2016-06-22 ENCOUNTER — Encounter (INDEPENDENT_AMBULATORY_CARE_PROVIDER_SITE_OTHER): Payer: Self-pay

## 2016-06-22 ENCOUNTER — Encounter: Payer: Self-pay | Admitting: Gastroenterology

## 2016-06-22 VITALS — BP 144/72 | HR 80 | Ht 68.0 in | Wt 195.8 lb

## 2016-06-22 DIAGNOSIS — R160 Hepatomegaly, not elsewhere classified: Secondary | ICD-10-CM

## 2016-06-22 DIAGNOSIS — F1010 Alcohol abuse, uncomplicated: Secondary | ICD-10-CM

## 2016-06-22 DIAGNOSIS — R1011 Right upper quadrant pain: Secondary | ICD-10-CM

## 2016-06-22 DIAGNOSIS — R188 Other ascites: Secondary | ICD-10-CM

## 2016-06-22 DIAGNOSIS — R1010 Upper abdominal pain, unspecified: Secondary | ICD-10-CM | POA: Diagnosis not present

## 2016-06-22 DIAGNOSIS — R600 Localized edema: Secondary | ICD-10-CM

## 2016-06-22 DIAGNOSIS — G8929 Other chronic pain: Secondary | ICD-10-CM

## 2016-06-22 LAB — CBC WITH DIFFERENTIAL/PLATELET
BASOS ABS: 0 10*3/uL (ref 0.0–0.2)
BASOS: 0 %
EOS (ABSOLUTE): 0 10*3/uL (ref 0.0–0.4)
Eos: 0 %
Hematocrit: 40.4 % (ref 37.5–51.0)
Hemoglobin: 13.9 g/dL (ref 12.6–17.7)
IMMATURE GRANS (ABS): 0 10*3/uL (ref 0.0–0.1)
IMMATURE GRANULOCYTES: 0 %
LYMPHS: 8 %
Lymphocytes Absolute: 0.6 10*3/uL — ABNORMAL LOW (ref 0.7–3.1)
MCH: 34.4 pg — ABNORMAL HIGH (ref 26.6–33.0)
MCHC: 34.4 g/dL (ref 31.5–35.7)
MCV: 100 fL — AB (ref 79–97)
MONOS ABS: 0.5 10*3/uL (ref 0.1–0.9)
Monocytes: 7 %
NEUTROS PCT: 85 %
Neutrophils Absolute: 6.7 10*3/uL (ref 1.4–7.0)
PLATELETS: 146 10*3/uL — AB (ref 150–379)
RBC: 4.04 x10E6/uL — ABNORMAL LOW (ref 4.14–5.80)
RDW: 13.2 % (ref 12.3–15.4)
WBC: 7.9 10*3/uL (ref 3.4–10.8)

## 2016-06-22 LAB — COMPREHENSIVE METABOLIC PANEL
A/G RATIO: 0.7 — AB (ref 1.2–2.2)
ALT: 22 IU/L (ref 0–44)
AST: 30 IU/L (ref 0–40)
Albumin: 2.9 g/dL — ABNORMAL LOW (ref 3.5–4.8)
Alkaline Phosphatase: 267 IU/L — ABNORMAL HIGH (ref 39–117)
BILIRUBIN TOTAL: 0.9 mg/dL (ref 0.0–1.2)
BUN/Creatinine Ratio: 17 (ref 10–24)
BUN: 14 mg/dL (ref 8–27)
CALCIUM: 9.4 mg/dL (ref 8.6–10.2)
CHLORIDE: 93 mmol/L — AB (ref 96–106)
CO2: 27 mmol/L (ref 18–29)
Creatinine, Ser: 0.84 mg/dL (ref 0.76–1.27)
GFR calc Af Amer: 101 mL/min/{1.73_m2} (ref >59)
GFR, EST NON AFRICAN AMERICAN: 87 mL/min/{1.73_m2} (ref >59)
GLUCOSE: 196 mg/dL — AB (ref 65–99)
Globulin, Total: 3.9 g/dL (ref 1.5–4.5)
POTASSIUM: 3.9 mmol/L (ref 3.5–5.2)
Sodium: 135 mmol/L (ref 134–144)
TOTAL PROTEIN: 6.8 g/dL (ref 6.0–8.5)

## 2016-06-22 LAB — LIPID PANEL W/O CHOL/HDL RATIO
Cholesterol, Total: 147 mg/dL (ref 100–199)
HDL: 34 mg/dL — AB (ref >39)
LDL Calculated: 96 mg/dL (ref 0–99)
TRIGLYCERIDES: 85 mg/dL (ref 0–149)
VLDL CHOLESTEROL CAL: 17 mg/dL (ref 5–40)

## 2016-06-22 LAB — IBC PANEL
Iron: 66 ug/dL (ref 42–165)
SATURATION RATIOS: 31.6 % (ref 20.0–50.0)
TRANSFERRIN: 149 mg/dL — AB (ref 212.0–360.0)

## 2016-06-22 LAB — TSH: TSH: 2.54 u[IU]/mL (ref 0.450–4.500)

## 2016-06-22 LAB — PROTIME-INR
INR: 1.3 ratio — ABNORMAL HIGH (ref 0.8–1.0)
PROTHROMBIN TIME: 14 s — AB (ref 9.6–13.1)

## 2016-06-22 LAB — FERRITIN: FERRITIN: 371.7 ng/mL — AB (ref 22.0–322.0)

## 2016-06-22 LAB — PSA: Prostate Specific Ag, Serum: 1.8 ng/mL (ref 0.0–4.0)

## 2016-06-22 MED ORDER — FUROSEMIDE 40 MG PO TABS: 40.0000 mg | ORAL_TABLET | Freq: Every day | ORAL | 1 refills | Status: DC

## 2016-06-22 MED ORDER — SPIRONOLACTONE 25 MG PO TABS: ORAL_TABLET | ORAL | 1 refills | Status: DC

## 2016-06-22 NOTE — Progress Notes (Signed)
06/22/2016 Richard Whitehead [id number] [birth year]   HISTORY OF PRESENT ILLNESS:  This is a 70 year old male who is new to our office and was referred by his PCP, Dr. Jeananne Rama, for evaluation of lower extremity edema and ascites with presumed cirrhosis. The patient reports that he has noticed swelling of his abdomen and lower extremities only for the past 8 days or so. Now the left leg is oozing fluid.  He admits that he has been drinking at least 3 beers daily for the past 45 years or so. He tells me that when he was 50 he had a doctor recommend that to him and has been doing it ever since.  Labs at PCPs office yesterday show a very mild splenomegaly with platelet count of 146. Alkaline phosphatase is elevated at 267, but AST, ALT, and total bilirubin are within normal limits. Albumin is low at 2.9.  Somewhat short of breath since increased abdominal girth but otherwise he has no complaints.  Past Medical History:  Diagnosis Date  . COPD (chronic obstructive pulmonary disease) ([place])   . Hyperglycemia   . Hypertension    Past Surgical History:  Procedure Laterality Date  . TONSILLECTOMY    . VASECTOMY      reports that he has been smoking Cigarettes.  He has been smoking about 1.00 pack per day. He has never used smokeless tobacco. He reports that he drinks about 13.2 oz of alcohol per week . He reports that he does not use drugs. family history includes Heart attack in his paternal grandfather; Pancreatic cancer in his mother. Allergies  Allergen Reactions  . Accupril [Quinapril Hcl] Cough      Outpatient Encounter Prescriptions as of 06/22/2016  Medication Sig  . carvedilol (COREG) 12.5 MG tablet Take 1 tablet (12.5 mg total) by mouth 2 (two) times daily with a meal.  . cloNIDine (CATAPRES) 0.1 MG tablet Take 1 tablet (0.1 mg total) by mouth 2 (two) times daily.  Marland Kitchen losartan (COZAAR) 100 MG tablet Take 1 tablet (100 mg total) by mouth daily.  . mupirocin ointment (BACTROBAN) 2 %  Apply 1 application topically 2 (two) times daily.  Marland Kitchen spironolactone (ALDACTONE) 25 MG tablet Take up to 4 pills daily.  . Tiotropium Bromide Monohydrate (SPIRIVA RESPIMAT) 1.25 MCG/ACT AERS Inhale 1 puff into the lungs every morning.  . [DISCONTINUED] spironolactone (ALDACTONE) 25 MG tablet Take 1 tablet (25 mg total) by mouth daily.  . furosemide (LASIX) 40 MG tablet Take 1 tablet (40 mg total) by mouth daily.   No facility-administered encounter medications on file as of 06/22/2016.      REVIEW OF SYSTEMS  : All other systems reviewed and negative except where noted in the History of Present Illness.   PHYSICAL EXAM: BP (!) 144/72   Pulse 80   Ht 5\' 8"  (1.727 m)   Wt 195 lb 12.8 oz (88.8 kg)   BMI 29.77 kg/m  General: Well developed white male in no acute distress Head: Normocephalic and atraumatic Eyes:  Sclerae anicteric, conjunctiva pink. Ears: Normal auditory acuity  Lungs: Clear throughout to auscultation Heart: Regular rate and rhythm Abdomen: Soft, distended with what seems to be ascites fluid.  BS present.  Non-tender.  Liver easily palpated. Musculoskeletal: Symmetrical with no gross deformities  Skin: No lesions on visible extremities Extremities:  3+ brawny/pitting edema in B/L LE's with fluid oozing on the left and skin changes c/w venous stasis. Neurological: Alert oriented x 4, grossly non-focal Psychological:  Alert  and cooperative. Normal mood and affect  ASSESSMENT AND PLAN: -70 year old male with new ascites by exam and lower extremity edema.  Suspected to have cirrhosis secondary to alcohol as he drinks at least 3 beers daily for the past 45 years. We are going to start by performing an abdominal ultrasound along with several labs. He will need a PT/INR, AFP, viral hepatitis studies, iron levels, AMA, AMA, anti-smooth muscle antibody. I am going to place him on Lasix 40 mg daily and will increase his spironolactone 200 mg daily. Needs 2 gram sodium diet and  should weight himself daily.  We will recheck a BMP again in 10 days following these changes. I have mentioned to him about EGD and colonoscopy, however, he has never been put to sleep and is afraid to do so. This can be discussed again at his follow-up visit with Dr. Loletha Carrow in approximately 6 weeks. -ETOH abuse:  Needs to work towards abstinence, which he has discussed with his PCP.  CC:  Guadalupe Maple, MD

## 2016-06-22 NOTE — Patient Instructions (Addendum)
You have been given a separate informational sheet regarding your tobacco use, the importance of quitting and local resources to help you quit.  You have been scheduled for a follow up with Dr Loletha Carrow on 08/08/16 @ 10:35 am.  Dennis Bast have been scheduled for an abdominal ultrasound at [facility] (1st floor of hospital) on Monday, 06/26/16 at 8:00 am. Please arrive 15 minutes prior to your appointment for registration. Make certain not to have anything to eat or drink 6 hours prior to your appointment. Should you need to reschedule your appointment, please contact radiology at [phone]. This test typically takes about 30 minutes to perform.  Your physician has requested that you go to the basement for lab work before leaving today.   We have sent the following medications to your pharmacy for you to pick up at your convenience: Lasix 40 mg daily Spironolactone 25 mg-Take 4 capsules daily (total of 100 mg daily). After the 25 mg tablets are gone, pick up 100 mg tablets (these have been sent) and take 1 capsule daily  If you are age 23 or older, your body mass index should be between 23-30. Your Body mass index is 29.77 kg/m. If this is out of the aforementioned range listed, please consider follow up with your Primary Care Provider.  If you are age 32 or younger, your body mass index should be between 19-25. Your Body mass index is 29.77 kg/m. If this is out of the aformentioned range listed, please consider follow up with your Primary Care Provider.   Go to Dr. Golden Pop for a lab on 9-25. Basic Metabolic Panel.

## 2016-06-23 LAB — HEPATITIS B SURFACE ANTIBODY,QUALITATIVE: HEP B S AB: NEGATIVE

## 2016-06-23 LAB — AFP TUMOR MARKER: AFP TUMOR MARKER: 2.5 ng/mL (ref <6.1)

## 2016-06-23 LAB — ANTI-SMOOTH MUSCLE ANTIBODY, IGG

## 2016-06-23 LAB — MITOCHONDRIAL/SMOOTH MUSCLE AB PNL
Mitochondrial Ab: 10.9 Units (ref 0.0–20.0)
Smooth Muscle Ab: 14 Units (ref 0–19)

## 2016-06-23 LAB — HEPATITIS C ANTIBODY: HCV AB: NEGATIVE

## 2016-06-23 LAB — HEPATITIS A ANTIBODY, TOTAL: HEP A TOTAL AB: NONREACTIVE

## 2016-06-23 LAB — HEPATITIS B SURFACE ANTIGEN: Hepatitis B Surface Ag: NEGATIVE

## 2016-06-23 LAB — ANA: ANA: NEGATIVE

## 2016-06-23 NOTE — Progress Notes (Signed)
Thank you for sending this case to me. I have reviewed the entire note, and the outlined plan seems appropriate.  I agree with the increase diuretic dose, sodium restriction and checking labs in about 10 days after med change. Good job on AFP/US for hematoma screening. When I see him, I will press him on EGD for variceal screening - I feel less strongly about colonoscopy at this juncture.

## 2016-06-26 ENCOUNTER — Ambulatory Visit (HOSPITAL_COMMUNITY): Payer: Medicare Other

## 2016-06-27 ENCOUNTER — Ambulatory Visit (HOSPITAL_COMMUNITY)
Admission: RE | Admit: 2016-06-27 | Discharge: 2016-06-27 | Disposition: A | Discharge status: Home / Self Care | Payer: Medicare Other | Source: Ambulatory Visit | Attending: Gastroenterology | Admitting: Gastroenterology

## 2016-06-27 DIAGNOSIS — R188 Other ascites: Secondary | ICD-10-CM

## 2016-06-27 DIAGNOSIS — R140 Abdominal distension (gaseous): Secondary | ICD-10-CM | POA: Diagnosis not present

## 2016-06-27 DIAGNOSIS — R600 Localized edema: Secondary | ICD-10-CM

## 2016-06-27 DIAGNOSIS — R160 Hepatomegaly, not elsewhere classified: Secondary | ICD-10-CM

## 2016-06-27 DIAGNOSIS — K8020 Calculus of gallbladder without cholecystitis without obstruction: Secondary | ICD-10-CM | POA: Insufficient documentation

## 2016-06-27 DIAGNOSIS — R161 Splenomegaly, not elsewhere classified: Secondary | ICD-10-CM | POA: Diagnosis not present

## 2016-06-27 DIAGNOSIS — R932 Abnormal findings on diagnostic imaging of liver and biliary tract: Secondary | ICD-10-CM | POA: Insufficient documentation

## 2016-06-27 DIAGNOSIS — F1010 Alcohol abuse, uncomplicated: Secondary | ICD-10-CM

## 2016-07-11 ENCOUNTER — Telehealth: Payer: Self-pay | Admitting: Gastroenterology

## 2016-07-11 ENCOUNTER — Other Ambulatory Visit: Payer: Self-pay

## 2016-07-11 DIAGNOSIS — R188 Other ascites: Principal | ICD-10-CM

## 2016-07-11 DIAGNOSIS — K7460 Unspecified cirrhosis of liver: Secondary | ICD-10-CM

## 2016-07-11 NOTE — Telephone Encounter (Signed)
Jess, have you reviewed the [place] results on this pt yet?

## 2016-07-12 ENCOUNTER — Other Ambulatory Visit: Payer: Self-pay

## 2016-07-12 ENCOUNTER — Other Ambulatory Visit (INDEPENDENT_AMBULATORY_CARE_PROVIDER_SITE_OTHER): Payer: Medicare Other

## 2016-07-12 ENCOUNTER — Telehealth: Payer: Self-pay

## 2016-07-12 DIAGNOSIS — R188 Other ascites: Secondary | ICD-10-CM

## 2016-07-12 DIAGNOSIS — K7460 Unspecified cirrhosis of liver: Secondary | ICD-10-CM

## 2016-07-12 DIAGNOSIS — K7031 Alcoholic cirrhosis of liver with ascites: Secondary | ICD-10-CM

## 2016-07-12 LAB — BASIC METABOLIC PANEL
BUN: 13 mg/dL (ref 6–23)
CHLORIDE: 97 meq/L (ref 96–112)
CO2: 29 meq/L (ref 19–32)
Calcium: 8.7 mg/dL (ref 8.4–10.5)
Creatinine, Ser: 0.76 mg/dL (ref 0.40–1.50)
GFR: 106.96 mL/min (ref >60.00)
GLUCOSE: 168 mg/dL — AB (ref 70–99)
POTASSIUM: 3.8 meq/L (ref 3.5–5.1)
SODIUM: 133 meq/L — AB (ref 135–145)

## 2016-07-12 NOTE — Telephone Encounter (Signed)
Labs are ok.  Ok to schedule paracentesis with max of 6 Liters removed.  Please make sure he has stopped drinking ETOH and that he is taking his diuretics as I prescribed at his visit.  Please send fluid from paracentesis for cell count, culture, cytology, and gram stain.  He needs to return one 5-7 days post paracentesis for repeat BMP as well then to recheck renal function.  Also advise him that the ascites will likely come right back again.  Thank you,  Jess

## 2016-07-12 NOTE — Telephone Encounter (Signed)
Paracentesis is 07/14/16 230 pm arrive at 215 pm no prep, pt was notified to have labs in 1 week, the order is in EPIC and he was advised that he does not need an appt.   He was also told to avoid ALL ETOH, he was advised to keep appt with Dr Loletha Carrow to discuss further treatment and that the fluid will likely return.  Confirmed that he is taking his diuretics as ordered.  Pt was notified that his labs from today are ok.

## 2016-07-12 NOTE — Telephone Encounter (Signed)
Pt walked into the office stating that he wanted to have the "fluid off his abdomen today", he says it took him 3 weeks to get an answer regarding his [place] (06/27/16) results.  He states he spoke with his daughter and she told him that this office was being negligent by not responding to him within 24-48 hours of the [place] result. Janett Billow reviewed the [place] on 07/08/16 (see response).  He was called on 07/11/16 with recommendations,all of his questions were answered and he was advised to have labs today (he did complete).    He wanted me to do the paracentesis on him in the exam room today.  I explained that I could not do that and it is a procedure that is performed at the hospital.  He then states he wants to go to [facility] "now" he said order it and I will go as soon as I leave here.  I again tried to explain that I could not order the paracentesis and he would need to keep his appt with Dr Loletha Carrow on 08/08/16.  He states he is going out of town next week and wants it done by Friday this week.  I advised him that I would send Dr Loletha Carrow and Alonza Bogus PA a note and advise them that he is uncomfortable and would like to have the paracentesis done by Friday before the office visit with Dr Loletha Carrow on 08/08/16.  He also kept asking how long the paracentesis would take, I advised I would ask when and if it is scheduled.

## 2016-07-13 LAB — MITOCHONDRIAL/SMOOTH MUSCLE AB PNL
MITOCHONDRIAL AB: 9.5 U (ref 0.0–20.0)
SMOOTH MUSCLE AB: 13 U (ref 0–19)

## 2016-07-13 NOTE — Telephone Encounter (Signed)
I have reviewed the messages below.  I have never seen the patient, but I reviewed his initial encounter with Alonza Bogus, PA.  The plan as outlined is appropriate.  We need to see that his creatinine remains stable after this volume of paracentesis.  If it is, then please increase his spironolactone to 100 mg once daily and continue current dose of furosemide 40 mg once daily.

## 2016-07-14 ENCOUNTER — Ambulatory Visit (HOSPITAL_COMMUNITY)
Admission: RE | Admit: 2016-07-14 | Discharge: 2016-07-14 | Disposition: A | Discharge status: Home / Self Care | Payer: Medicare Other | Source: Ambulatory Visit | Attending: Gastroenterology | Admitting: Gastroenterology

## 2016-07-14 ENCOUNTER — Ambulatory Visit (HOSPITAL_COMMUNITY): Payer: Medicare Other

## 2016-07-14 DIAGNOSIS — R188 Other ascites: Secondary | ICD-10-CM | POA: Diagnosis not present

## 2016-07-14 DIAGNOSIS — K7031 Alcoholic cirrhosis of liver with ascites: Secondary | ICD-10-CM | POA: Diagnosis not present

## 2016-07-14 LAB — BODY FLUID CELL COUNT WITH DIFFERENTIAL
Lymphs, Fluid: 46 %
MONOCYTE-MACROPHAGE-SEROUS FLUID: 17 % — AB (ref 50–90)
NEUTROPHIL FLUID: 37 % — AB (ref 0–25)
WBC FLUID: 1694 uL — AB (ref 0–1000)

## 2016-07-14 NOTE — Procedures (Signed)
Ultrasound-guided diagnostic and therapeutic paracentesis performed yielding 4.5 liters of yellow colored fluid. No immediate complications.  Janilah Hojnacki E 3:07 PM 07/14/2016

## 2016-07-17 NOTE — Telephone Encounter (Signed)
I believe that I placed him on lasix 40 mg daily and spironolactone 100 mg daily at his office visit.  I just realized that my note says 200 mg spironolactone, but I think that dragon just misinterpreted "will increase spironolactone to 100 mg daily".  Jess

## 2016-07-18 LAB — BODY FLUID CULTURE: CULTURE: NO GROWTH

## 2016-07-20 ENCOUNTER — Other Ambulatory Visit: Payer: Self-pay

## 2016-07-20 MED ORDER — CIPROFLOXACIN HCL 500 MG PO TABS: 500.0000 mg | ORAL_TABLET | Freq: Two times a day (BID) | ORAL | 0 refills | Status: DC

## 2016-08-04 ENCOUNTER — Telehealth: Payer: Self-pay | Admitting: Gastroenterology

## 2016-08-04 NOTE — Telephone Encounter (Signed)
Please see OV note from 06/22/16. OV notes says that patient has appointment with Dr Loletha Carrow on 08/08/16 @ 10:35 am. Patients appointment was scheduled for 08/08/16 @ 3:45pm. Patient is very upset over this and started cussing and stating that "our office is very incompetant". He states that he needs to come in at 10:45am because he has already made plans.

## 2016-08-04 NOTE — Telephone Encounter (Signed)
Dr Loletha Carrow, please advise. Pt insists on coming at 1035am .Not 345pm. Looks like an error was made on the pts after visit summary.

## 2016-08-04 NOTE — Telephone Encounter (Signed)
He can come at 10:45 if it was our mistake.  Please do not put anyone in the 3:45 slot that day

## 2016-08-04 NOTE — Telephone Encounter (Signed)
Patient called back in. He is aware to come in at 10:45am

## 2016-08-04 NOTE — Telephone Encounter (Signed)
Left a message to return call.  

## 2016-08-07 NOTE — Telephone Encounter (Signed)
Left a message on voicemail with detailed appointment information.

## 2016-08-08 ENCOUNTER — Other Ambulatory Visit (INDEPENDENT_AMBULATORY_CARE_PROVIDER_SITE_OTHER): Payer: Medicare Other

## 2016-08-08 ENCOUNTER — Other Ambulatory Visit: Payer: Self-pay

## 2016-08-08 ENCOUNTER — Ambulatory Visit (INDEPENDENT_AMBULATORY_CARE_PROVIDER_SITE_OTHER): Payer: Medicare Other | Admitting: Gastroenterology

## 2016-08-08 ENCOUNTER — Encounter: Payer: Self-pay | Admitting: Gastroenterology

## 2016-08-08 VITALS — BP 110/78 | HR 82 | Ht 68.5 in | Wt 180.0 lb

## 2016-08-08 DIAGNOSIS — K7031 Alcoholic cirrhosis of liver with ascites: Secondary | ICD-10-CM | POA: Diagnosis not present

## 2016-08-08 DIAGNOSIS — Z23 Encounter for immunization: Secondary | ICD-10-CM | POA: Diagnosis not present

## 2016-08-08 DIAGNOSIS — K8020 Calculus of gallbladder without cholecystitis without obstruction: Secondary | ICD-10-CM | POA: Diagnosis not present

## 2016-08-08 DIAGNOSIS — R160 Hepatomegaly, not elsewhere classified: Secondary | ICD-10-CM

## 2016-08-08 LAB — BASIC METABOLIC PANEL
BUN: 23 mg/dL (ref 6–23)
CALCIUM: 9.7 mg/dL (ref 8.4–10.5)
CO2: 28 mEq/L (ref 19–32)
Chloride: 100 mEq/L (ref 96–112)
Creatinine, Ser: 0.82 mg/dL (ref 0.40–1.50)
GFR: 97.96 mL/min (ref >60.00)
Glucose, Bld: 76 mg/dL (ref 70–99)
Potassium: 4.5 mEq/L (ref 3.5–5.1)
SODIUM: 135 meq/L (ref 135–145)

## 2016-08-08 MED ORDER — SPIRONOLACTONE 100 MG PO TABS: 150.0000 mg | ORAL_TABLET | Freq: Every day | ORAL | 3 refills | Status: DC

## 2016-08-08 MED ORDER — FUROSEMIDE 40 MG PO TABS: 60.0000 mg | ORAL_TABLET | Freq: Every day | ORAL | 3 refills | Status: DC

## 2016-08-08 NOTE — Progress Notes (Signed)
[facility] Progress Note  Chief Complaint: Cirrhosis with ascites  Subjective  History:  Mr. Richard Whitehead follows up for his alcoholic cirrhosis with ascites. He had a paracentesis on 07/14/2016 and had significant relief of abdominal pressure and urinary retention. He has been on Aldactone 100 mg and Lasix 40 mg daily. He and his wife also report that he is been good about his sodium restriction. With all that, his weight is down 15 pounds since his 06/22/2016 initial office visit. He has also been abstinent from alcohol for over a month.   ROS: Cardiovascular:  no chest pain Respiratory: no dyspnea  The patient's Past Medical, Family and Social History were reviewed and are on file in the EMR. He got a flu shot this year, and reports a previous pneumonia vaccine Objective:  Med list reviewed  Vital signs in last 24 hrs: Vitals:   08/08/16 1033  BP: 110/78  Pulse: 82    Physical Exam His wife is present Chronically ill-appearing elderly man, alert conversational and appropriate Speech fluent, mental status clear  HEENT: sclera anicteric, oral mucosa moist without lesions  Neck: supple, no thyromegaly, JVD or lymphadenopathy  Cardiac: RRR without murmurs, S1S2 heard, 1+ edema. He is wearing support stockings today.  Pulm: clear to auscultation bilaterally, normal RR and effort noted  Abdomen: soft, no tenderness, with active bowel sounds. No spleen tip is palpable, but his liver is felt 5 fingerbreadths below the costal margin without inspiration. He has moderate ascites that is not tense, positive for shifting dullness  Skin; warm and dry, no jaundice or rash He is steady when he stands with eyes closed, no asterixis Recent Labs:  CMP Latest Ref Rng & Units 08/08/2016 07/12/2016 06/21/2016  Glucose 70 - 99 mg/dL 76 168(H) 196(H)  BUN 6 - 23 mg/dL 23 13 14   Creatinine 0.40 - 1.50 mg/dL 0.82 0.76 0.84  Sodium 135 - 145 mEq/L 135 133(L) 135  Potassium 3.5 - 5.1 mEq/L 4.5 3.8  3.9  Chloride 96 - 112 mEq/L 100 97 93(L)  CO2 19 - 32 mEq/L 28 29 27   Calcium 8.4 - 10.5 mg/dL 9.7 8.7 9.4  Total Protein 6.0 - 8.5 g/dL - - 6.8  Total Bilirubin 0.0 - 1.2 mg/dL - - 0.9  Alkaline Phos 39 - 117 IU/L - - 267(H)  AST 0 - 40 IU/L - - 30  ALT 0 - 44 IU/L - - 22   Treated with cipro for SBP  - see 10/6/ fluid cell count Iron studies c/w EtOH and not HH INR 1.3 AFP 2.5 ANA,ASMA, AMA neg Hep A,B,C neg  Radiologic studies: [place]: gallstones, no liver mass, + ascites   @ASSESSMENTPLANBEGIN @ Assessment: Encounter Diagnoses  Name Primary?  . Alcoholic cirrhosis of liver with ascites ([place]) Yes  . Ascites due to alcoholic cirrhosis ([place])   . Gallstones   He is much improved on sodium restriction and diuretic therapy. His gallstones are asymptomatic, and I think the risks of surgery outweigh the expected benefits at this point. I reviewed all of his testing thus far with him and his wife. Other questions are answered Plan: We gave him the first Twinrix vaccine BMP, then consider modest dose increase in diuretics. I advised him to have an EGD screen for esophageal varices, but he declines at this time. Over, he is on carvedilol, which should  some decrease in portal pressure See me in 2 months .Nelida Meuse III

## 2016-08-08 NOTE — Patient Instructions (Addendum)
If you are age 72 or older, your body mass index should be between 23-30. Your Body mass index is 26.97 kg/m. If this is out of the aforementioned range listed, please consider follow up with your Primary Care Provider.  If you are age 36 or younger, your body mass index should be between 19-25. Your Body mass index is 26.97 kg/m. If this is out of the aformentioned range listed, please consider follow up with your Primary Care Provider.   Please follow up in 2 months with Dr Loletha Carrow  Today we have given you the Twinrix injection #1  Your 2nd injection is on   Thank you for choosing [facility]  Dr Wilfrid Lund III

## 2016-08-15 ENCOUNTER — Telehealth: Payer: Self-pay | Admitting: *Deleted

## 2016-08-15 NOTE — Telephone Encounter (Signed)
Patient having labs done at Dr. Alice Rieger office.

## 2016-08-15 NOTE — Telephone Encounter (Signed)
-----   Message from [place] sent at 06/22/2016 11:07 AM EDT ----- Janett Billow wants the pt to have a BMP lab for 10 days out, which would be 9-25.  He wants to go to Dr. Alice Rieger office to do that. It is close to his home. I put it in as clinic collect, resulting agency as other.

## 2016-08-22 ENCOUNTER — Encounter: Payer: Self-pay | Admitting: Family Medicine

## 2016-08-22 ENCOUNTER — Ambulatory Visit (INDEPENDENT_AMBULATORY_CARE_PROVIDER_SITE_OTHER): Payer: Medicare Other | Admitting: Family Medicine

## 2016-08-22 ENCOUNTER — Other Ambulatory Visit (INDEPENDENT_AMBULATORY_CARE_PROVIDER_SITE_OTHER): Payer: Medicare Other

## 2016-08-22 DIAGNOSIS — R160 Hepatomegaly, not elsewhere classified: Secondary | ICD-10-CM | POA: Diagnosis not present

## 2016-08-22 DIAGNOSIS — I10 Essential (primary) hypertension: Secondary | ICD-10-CM

## 2016-08-22 LAB — BASIC METABOLIC PANEL
BUN: 45 mg/dL — ABNORMAL HIGH (ref 6–23)
CALCIUM: 9.8 mg/dL (ref 8.4–10.5)
CO2: 25 mEq/L (ref 19–32)
CREATININE: 1.15 mg/dL (ref 0.40–1.50)
Chloride: 98 mEq/L (ref 96–112)
GFR: 66.3 mL/min (ref >60.00)
GLUCOSE: 94 mg/dL (ref 70–99)
Potassium: 4.9 mEq/L (ref 3.5–5.1)
SODIUM: 129 meq/L — AB (ref 135–145)

## 2016-08-22 NOTE — Assessment & Plan Note (Signed)
Follow-up blood pressure with getting too low now that patient quit drinking and ascites is better. We will start tapering off medications by stopping clonidine Will taper by taking half a tablet twice a day for 2 days then half a tablet each morning for 2 days then discontinuing

## 2016-08-22 NOTE — Progress Notes (Signed)
BP (!) 109/58   Pulse 67   Temp 97.4 F (36.3 C)   Wt 172 lb (78 kg)   SpO2 96%   BMI 25.77 kg/m    Subjective:    Patient ID: Richard Whitehead, male    DOB: 1946/03/31, 70 y.o.   MRN: [id number]  HPI: Richard Whitehead is a 70 y.o. male  Chief Complaint  Patient presents with  . Hypertension    feels his bp is too low. Has been feeling dizzy the last few days.  Reviewed current medical condition was patient has not drunk any in over 6 weeks. Finally had ascites removed from his abdomen and felt thousand percent better. Takes it cleared up wearing support hose and no further issues with his legs no edema. Patient's eating better doing better getting better nutrition. Patient's biggest consideration is feels draggy and lightheaded with low blood pressure.  Relevant past medical, surgical, family and social history reviewed and updated as indicated. Interim medical history since our last visit reviewed. Allergies and medications reviewed and updated.  Review of Systems  Constitutional: Negative.   Respiratory: Negative.   Cardiovascular: Negative.     Per HPI unless specifically indicated above     Objective:    BP (!) 109/58   Pulse 67   Temp 97.4 F (36.3 C)   Wt 172 lb (78 kg)   SpO2 96%   BMI 25.77 kg/m   Wt Readings from Last 3 Encounters:  08/22/16 172 lb (78 kg)  08/08/16 180 lb (81.6 kg)  06/22/16 195 lb 12.8 oz (88.8 kg)    Physical Exam  Constitutional: He is oriented to person, place, and time. He appears well-developed and well-nourished. No distress.  HENT:  Head: Normocephalic and atraumatic.  Right Ear: Hearing normal.  Left Ear: Hearing normal.  Nose: Nose normal.  Eyes: Conjunctivae and lids are normal. Right eye exhibits no discharge. Left eye exhibits no discharge. No scleral icterus.  Cardiovascular: Normal rate, regular rhythm and normal heart sounds.   Pulmonary/Chest: Effort normal and breath sounds normal. No respiratory distress.    Musculoskeletal: Normal range of motion.  Neurological: He is alert and oriented to person, place, and time.  Skin: Skin is intact. No rash noted.  Psychiatric: He has a normal mood and affect. His speech is normal and behavior is normal. Judgment and thought content normal. Cognition and memory are normal.    Results for orders placed or performed in visit on [id number]  Basic Metabolic Panel (BMET)  Result Value Ref Range   Sodium 129 (L) 135 - 145 mEq/L   Potassium 4.9 3.5 - 5.1 mEq/L   Chloride 98 96 - 112 mEq/L   CO2 25 19 - 32 mEq/L   Glucose, Bld 94 70 - 99 mg/dL   BUN 45 (H) 6 - 23 mg/dL   Creatinine, Ser 1.15 0.40 - 1.50 mg/dL   Calcium 9.8 8.4 - 10.5 mg/dL   GFR 66.30 >60.00 mL/min      Assessment & Plan:   Problem List Items Addressed This Visit      Cardiovascular and Mediastinum   Hypertension    Follow-up blood pressure with getting too low now that patient quit drinking and ascites is better. We will start tapering off medications by stopping clonidine Will taper by taking half a tablet twice a day for 2 days then half a tablet each morning for 2 days then discontinuing          Follow  up plan: Return in about 2 months (around 10/22/2016).

## 2016-08-23 ENCOUNTER — Telehealth: Payer: Self-pay

## 2016-08-23 NOTE — Telephone Encounter (Signed)
Dr. Loletha Carrow sent you a  Message in regards to this patient and possibly stopping one of his meds. Just wanted to make sure you see it. They'd like him to come in on Monday for a BMP.

## 2016-08-24 ENCOUNTER — Telehealth: Payer: Self-pay | Admitting: Family Medicine

## 2016-08-24 DIAGNOSIS — K7031 Alcoholic cirrhosis of liver with ascites: Secondary | ICD-10-CM

## 2016-08-24 NOTE — Telephone Encounter (Signed)
-----   Message from Moss Mc, [place] sent at 08/24/2016 11:29 AM EST ----- Phone call

## 2016-08-24 NOTE — Telephone Encounter (Signed)
Phone call discussed with patient and assisted him with written directions for stopping spironolactone, furosemide, and losartan. Patient will come back in 4 days for BMP and lipid pressure check.

## 2016-08-28 ENCOUNTER — Ambulatory Visit (INDEPENDENT_AMBULATORY_CARE_PROVIDER_SITE_OTHER): Payer: Medicare Other | Admitting: Family Medicine

## 2016-08-28 ENCOUNTER — Encounter: Payer: Self-pay | Admitting: Family Medicine

## 2016-08-28 VITALS — BP 136/80 | HR 70 | Temp 98.0°F | Wt 172.6 lb

## 2016-08-28 DIAGNOSIS — F1010 Alcohol abuse, uncomplicated: Secondary | ICD-10-CM

## 2016-08-28 DIAGNOSIS — I10 Essential (primary) hypertension: Secondary | ICD-10-CM

## 2016-08-28 DIAGNOSIS — R160 Hepatomegaly, not elsewhere classified: Secondary | ICD-10-CM | POA: Diagnosis not present

## 2016-08-28 DIAGNOSIS — K7031 Alcoholic cirrhosis of liver with ascites: Secondary | ICD-10-CM

## 2016-08-28 NOTE — Patient Instructions (Signed)
Follow up as needed

## 2016-08-28 NOTE — Assessment & Plan Note (Signed)
Continue carvedilol and amlodipine. Increase pure water intake for better hydration. Discussed that caffeine and sugary beverages work against hydration and it may help dizziness to drink more water.

## 2016-08-28 NOTE — Progress Notes (Signed)
BP 136/80 (BP Location: Left Arm, Patient Position: Sitting, Cuff Size: Large)   Pulse 70   Temp 98 F (36.7 C)   Wt 172 lb 9.6 oz (78.3 kg)   SpO2 98%   BMI 25.86 kg/m    Subjective:    Patient ID: Richard Whitehead, male    DOB: 1945/10/25, 70 y.o.   MRN: [id number]  HPI: Richard Whitehead is a 70 y.o. male  Chief Complaint  Patient presents with  . Hypertension    pt states that he has questions for dr Jeananne Rama about medications he isn't sure what he is suppose to be taking. He would like for dr Jeananne Rama to write them down for him   Patient presents for BP check and some labs requested by another provider. He has been tapering off of some of his BP medications, now only taking amlodipine and carvedilol. Still having some dizziness - mainly when standing up or bending over, but not as frequent. BPs have been around 120/80 and below when checked at home. States it is high now because he is angry that he is having to be seen for a visit, misunderstood message from provider. Continuing to restrict salt. Does admit to drinking very little water. Almost completely coffee, juice, and diet sodas. Denies CP, SOB, HAs.   Past Medical History:  Diagnosis Date  . Cirrhosis ([place])   . COPD (chronic obstructive pulmonary disease) ([place])   . Hyperglycemia   . Hypertension    Social History   Social History  . Marital status: Married    Spouse name: June  . Number of children: 3  . Years of education: N/A   Occupational History  . retired, part time builds [employer]    Social History Main Topics  . Smoking status: Current Every Day Smoker    Packs/day: 1.00    Years: 50.00    Types: Cigarettes  . Smokeless tobacco: Never Used     Comment: form given 06-22-26  . Alcohol use No     Comment: quit sept 2017  . Drug use: No  . Sexual activity: Not on file   Other Topics Concern  . Not on file   Social History Narrative  . No narrative on file    Relevant past medical, surgical,  family and social history reviewed and updated as indicated. Interim medical history since our last visit reviewed. Allergies and medications reviewed and updated.  Review of Systems  Constitutional: Negative.   HENT: Negative.   Respiratory: Negative.   Cardiovascular: Negative.   Gastrointestinal: Negative.   Genitourinary: Negative.   Musculoskeletal: Negative.   Skin: Negative.   Neurological: Positive for dizziness.  Psychiatric/Behavioral: Negative.     Per HPI unless specifically indicated above     Objective:    BP 136/80 (BP Location: Left Arm, Patient Position: Sitting, Cuff Size: Large)   Pulse 70   Temp 98 F (36.7 C)   Wt 172 lb 9.6 oz (78.3 kg)   SpO2 98%   BMI 25.86 kg/m   Wt Readings from Last 3 Encounters:  08/28/16 172 lb 9.6 oz (78.3 kg)  08/22/16 172 lb (78 kg)  08/08/16 180 lb (81.6 kg)    Physical Exam  Constitutional: He is oriented to person, place, and time. He appears well-developed and well-nourished. No distress.  HENT:  Head: Atraumatic.  Eyes: Conjunctivae are normal. Pupils are equal, round, and reactive to light. No scleral icterus.  Neck: Normal range of motion. Neck supple.  Cardiovascular: Normal rate.   Pulmonary/Chest: Effort normal. No respiratory distress.  Musculoskeletal: Normal range of motion.  Neurological: He is alert and oriented to person, place, and time.  Skin: Skin is warm and dry.  Psychiatric:  Agitated  Nursing note and vitals reviewed.     Assessment & Plan:   Problem List Items Addressed This Visit      Cardiovascular and Mediastinum   Hypertension - Primary    Continue carvedilol and amlodipine. Increase pure water intake for better hydration. Discussed that caffeine and sugary beverages work against hydration and it may help dizziness to drink more water.       Relevant Medications   amlodipine-atorvastatin (CADUET) 10-10 MG tablet     Digestive   Hepatomegaly   Relevant Orders   Hepatitis C  antibody     Other   Ascites   ETOH abuse    Await BMP and hep c results requested by [facility].   Follow up plan: Return if symptoms worsen or fail to improve.

## 2016-08-29 ENCOUNTER — Encounter: Payer: Self-pay | Admitting: Family Medicine

## 2016-08-29 LAB — BASIC METABOLIC PANEL
BUN/Creatinine Ratio: 36 — ABNORMAL HIGH (ref 10–24)
BUN: 31 mg/dL — AB (ref 8–27)
CALCIUM: 9.8 mg/dL (ref 8.6–10.2)
CO2: 21 mmol/L (ref 18–29)
CREATININE: 0.85 mg/dL (ref 0.76–1.27)
Chloride: 95 mmol/L — ABNORMAL LOW (ref 96–106)
GFR calc Af Amer: 101 mL/min/{1.73_m2} (ref >59)
GFR calc non Af Amer: 87 mL/min/{1.73_m2} (ref >59)
GLUCOSE: 150 mg/dL — AB (ref 65–99)
Potassium: 4.7 mmol/L (ref 3.5–5.2)
Sodium: 133 mmol/L — ABNORMAL LOW (ref 134–144)

## 2016-08-29 LAB — HEPATITIS C ANTIBODY: HEP C VIRUS AB: 0.1 {s_co_ratio} (ref 0.0–0.9)

## 2016-08-30 ENCOUNTER — Encounter: Payer: Self-pay | Admitting: Family Medicine

## 2016-09-09 ENCOUNTER — Other Ambulatory Visit: Payer: Self-pay | Admitting: Family Medicine

## 2016-09-09 DIAGNOSIS — I10 Essential (primary) hypertension: Secondary | ICD-10-CM

## 2016-09-11 NOTE — Telephone Encounter (Signed)
Routing to provider, next appt 10/26/16

## 2016-09-12 ENCOUNTER — Ambulatory Visit (INDEPENDENT_AMBULATORY_CARE_PROVIDER_SITE_OTHER): Payer: Medicare Other | Admitting: Gastroenterology

## 2016-09-12 ENCOUNTER — Telehealth: Payer: Self-pay

## 2016-09-12 VITALS — BP 120/64 | HR 80

## 2016-09-12 DIAGNOSIS — K7460 Unspecified cirrhosis of liver: Secondary | ICD-10-CM

## 2016-09-12 DIAGNOSIS — R188 Other ascites: Principal | ICD-10-CM

## 2016-09-12 DIAGNOSIS — Z23 Encounter for immunization: Secondary | ICD-10-CM

## 2016-09-12 NOTE — Telephone Encounter (Signed)
Please have him take an additional afternoon dose of both his furosemide and spironolactone for the next three days.

## 2016-09-12 NOTE — Telephone Encounter (Signed)
Patient was here for TwinRx injection today. He wanted to report that he has gained 7 lbs. in 2 weeks. Weighed 179.2 on home scales this morning. Feels like he has more abdominal fluid. Last patient was told was to stop the furosemide and spironlactone. BP today was 120/64, pulse was 80. Patient is taking in 70 grams of protein a day. Please advise.

## 2016-09-13 NOTE — Telephone Encounter (Signed)
Patient advised to take his afternoon dose of furosemide and spironolactone for the next 3 days. He understands to let us know how he is feeling next week, or sooner if needed.

## 2016-09-18 ENCOUNTER — Telehealth: Payer: Self-pay | Admitting: Gastroenterology

## 2016-09-18 ENCOUNTER — Other Ambulatory Visit: Payer: Self-pay

## 2016-09-18 DIAGNOSIS — R188 Other ascites: Secondary | ICD-10-CM

## 2016-09-18 NOTE — Telephone Encounter (Signed)
Called patient's home number, spoke to June his wife, gave her Dr. Loletha Carrow' recommendations on taking the medications and getting labs done at our lab. She will be sure patient gets message.

## 2016-09-18 NOTE — Telephone Encounter (Signed)
Called patient back, had to lvm to restart spironolactone 100 mg and furosemide 40 mg once daily. Asked that he come to our lab next Monday and get some blood work.

## 2016-09-18 NOTE — Telephone Encounter (Signed)
Patient called today, reports that he took his spironolactone and furosemide last week as directed (Wednesday, Thursday, Friday), Saturday he did not urinate very much or Sunday morning. He took a dose of both medications yesterday around 2:00 pm, with good results. Reports no abdominal swelling. He wants to know how he should take his medications going forward. Please advise.

## 2016-09-18 NOTE — Telephone Encounter (Signed)
Starting tomorrow, begin spironolactone 100 mg once daily and furosemide 40 mg once daily.  I would like him to have a BMP next Monday.  So we are all on the same page:    Because we are in clinic and/or procedures all day, calls to our physicians for clinical questions may take at least an entire business day for an answer.  Results of labs may take 2-3 business days for a report to be called to patient.

## 2016-09-26 ENCOUNTER — Other Ambulatory Visit (INDEPENDENT_AMBULATORY_CARE_PROVIDER_SITE_OTHER): Payer: Medicare Other

## 2016-09-26 DIAGNOSIS — R188 Other ascites: Secondary | ICD-10-CM | POA: Diagnosis not present

## 2016-09-26 LAB — BASIC METABOLIC PANEL
BUN: 23 mg/dL (ref 6–23)
CHLORIDE: 96 meq/L (ref 96–112)
CO2: 30 mEq/L (ref 19–32)
Calcium: 9.7 mg/dL (ref 8.4–10.5)
Creatinine, Ser: 0.91 mg/dL (ref 0.40–1.50)
GFR: 86.83 mL/min (ref >60.00)
GLUCOSE: 147 mg/dL — AB (ref 70–99)
POTASSIUM: 4.2 meq/L (ref 3.5–5.1)
SODIUM: 132 meq/L — AB (ref 135–145)

## 2016-10-18 ENCOUNTER — Ambulatory Visit (INDEPENDENT_AMBULATORY_CARE_PROVIDER_SITE_OTHER): Payer: Medicare Other | Admitting: Gastroenterology

## 2016-10-18 ENCOUNTER — Encounter: Payer: Self-pay | Admitting: Gastroenterology

## 2016-10-18 VITALS — BP 130/68 | HR 68 | Ht 68.0 in | Wt 167.0 lb

## 2016-10-18 DIAGNOSIS — K7031 Alcoholic cirrhosis of liver with ascites: Secondary | ICD-10-CM

## 2016-10-18 NOTE — Progress Notes (Signed)
 GI Progress Note  Chief Complaint: Alcoholic cirrhosis with ascites  Subjective  History: Richard Whitehead follows up for his alcoholic cirrhosis with ascites. I last saw him on 08/08/2016. His blood pressure was low and he was feeling lightheaded, and Dr.Crissman discontinued his clonidine. Labs also showed low sodium and elevated BUN, indicating some stress on his kidneys. We briefly held his diuretics as well, and they were resumed in early November. He is also using compression stockings, which have helped his peripheral edema. He has noticed no recent change in abdominal girth. He typically takes his diuretics around mid day, and reports that his wife has been diligent about monitoring his sodium intake.Richard Whitehead has been abstinent from alcohol for over 4 months. He had his second Twinrix vaccine last month.  ROS: Cardiovascular:  no chest pain Respiratory: no dyspnea  The patient's Past Medical, Family and Social History were reviewed and are on file in the EMR.  Objective:  Med list reviewed  Vital signs in last 24 hrs: Vitals:   10/18/16 0957  BP: 130/68  Pulse: 68   Weight: 167#, down from 172 on 11/20 and 180 on 10/31  Physical Exam  Ruddy complexion as before, fair muscle mass  HEENT: sclera anicteric, oral mucosa moist without lesions  Neck: supple, no thyromegaly, JVD or lymphadenopathy  Cardiac: RRR without murmurs, S1S2 heard, no peripheral edema  Pulm: clear to auscultation bilaterally, normal RR and effort noted  Abdomen: soft, no tenderness, with active bowel sounds. His liver is felt 8 fingerbreadths below the costal margin as before. He has bulging flanks, abdomen is soft and nondistended.  Skin; warm and dry, no jaundice or rash  Recent Labs:  CMP Latest Ref Rng & Units 09/26/2016 08/28/2016 08/22/2016  Glucose 70 - 99 mg/dL 147(H) 150(H) 94  BUN 6 - 23 mg/dL 23 31(H) 45(H)  Creatinine 0.40 - 1.50 mg/dL 0.91 0.85 1.15  Sodium 135 - 145 mEq/L 132(L) 133(L)  129(L)  Potassium 3.5 - 5.1 mEq/L 4.2 4.7 4.9  Chloride 96 - 112 mEq/L 96 95(L) 98  CO2 19 - 32 mEq/L 30 21 25   Calcium 8.4 - 10.5 mg/dL 9.7 9.8 9.8  Total Protein 6.0 - 8.5 g/dL - - -  Total Bilirubin 0.0 - 1.2 mg/dL - - -  Alkaline Phos 39 - 117 IU/L - - -  AST 0 - 40 IU/L - - -  ALT 0 - 44 IU/L - - -     @ASSESSMENTPLANBEGIN @ Assessment: Encounter Diagnoses  Name Primary?  . Alcoholic cirrhosis of liver with ascites ([place]) Yes  . Ascites due to alcoholic cirrhosis ([facility])     We seem to have his fluid well managed at this point.  Plan:  Continue alcohol abstinence Continue spironolactone 100 mg daily and furosemide 40 mg daily 2 g daily sodium restriction Follow-up in 4 months to recheck CMP, INR, AFP and screening ultrasound. He will need his third vaccination at that visit as well. He reports having gotten a flu vaccine this year.  He again declined a screening endoscopy to look for varices.  Total time on 5 minutes, over half spent in counseling and coordination of care.   Nelida Meuse III

## 2016-10-18 NOTE — Patient Instructions (Signed)
If you are age 73 or older, your body mass index should be between 23-30. Your Body mass index is 25.39 kg/m. If this is out of the aforementioned range listed, please consider follow up with your Primary Care Provider.  If you are age 58 or younger, your body mass index should be between 19-25. Your Body mass index is 25.39 kg/m. If this is out of the aformentioned range listed, please consider follow up with your Primary Care Provider.   Please follow up in 4 months with Dr Loletha Carrow [phone]. We will put in a recall to schedule you closer to the follow up appointment.  Thank you for choosing [facility]  Dr Wilfrid Lund III

## 2016-10-26 ENCOUNTER — Ambulatory Visit: Payer: Medicare Other | Admitting: Family Medicine

## 2016-11-10 ENCOUNTER — Other Ambulatory Visit: Payer: Self-pay | Admitting: Family Medicine

## 2016-11-10 DIAGNOSIS — I10 Essential (primary) hypertension: Secondary | ICD-10-CM

## 2016-11-14 ENCOUNTER — Encounter: Payer: Self-pay | Admitting: Family Medicine

## 2016-11-14 ENCOUNTER — Telehealth: Payer: Self-pay | Admitting: Gastroenterology

## 2016-11-14 ENCOUNTER — Ambulatory Visit (INDEPENDENT_AMBULATORY_CARE_PROVIDER_SITE_OTHER): Payer: Medicare Other | Admitting: Family Medicine

## 2016-11-14 DIAGNOSIS — J449 Chronic obstructive pulmonary disease, unspecified: Secondary | ICD-10-CM

## 2016-11-14 DIAGNOSIS — R748 Abnormal levels of other serum enzymes: Secondary | ICD-10-CM

## 2016-11-14 DIAGNOSIS — K7031 Alcoholic cirrhosis of liver with ascites: Secondary | ICD-10-CM

## 2016-11-14 DIAGNOSIS — I10 Essential (primary) hypertension: Secondary | ICD-10-CM

## 2016-11-14 MED ORDER — AMLODIPINE BESYLATE 5 MG PO TABS: 5.0000 mg | ORAL_TABLET | Freq: Every day | ORAL | 7 refills | Status: DC

## 2016-11-14 NOTE — Assessment & Plan Note (Signed)
Blood pressure doing well with stopping alcohol and decreased weight with good fluid management. Discussed fluid management with patient and will decrease amlodipine to 5 mg. Patient will observe blood pressure to make sure not getting too low or too high. If so will notify us.

## 2016-11-14 NOTE — Assessment & Plan Note (Signed)
The current medical regimen is effective;  continue present plan and medications.  

## 2016-11-14 NOTE — Assessment & Plan Note (Signed)
Recheck today. 

## 2016-11-14 NOTE — Telephone Encounter (Signed)
Pharmacy has already been updated.

## 2016-11-14 NOTE — Progress Notes (Signed)
BP 138/75   Pulse 62   Ht 5\' 9"  (1.753 m)   Wt 172 lb 9.6 oz (78.3 kg)   SpO2 99%   BMI 25.49 kg/m    Subjective:    Patient ID: Richard Whitehead, male    DOB: 1946-07-24, 71 y.o.   MRN: [id number]  HPI: Richard Whitehead is a 71 y.o. male  Chief Complaint  Patient presents with  . Follow-up  . Hypertension  Patient all in all doing great blood pressure sometimes lower wondering about taking fluid pills and has no further edema in his legs and ascites has gone down. Spent over is been over 5 months since last drink any beer. Patient has been doing well. Feels much better and is off 3 different blood pressure medicines. Patient with occasional lightheaded when standing up but nothing consistent. Good reports from GI who is trying to lose for several months  Relevant past medical, surgical, family and social history reviewed and updated as indicated. Interim medical history since our last visit reviewed. Allergies and medications reviewed and updated.  Review of Systems  Constitutional: Negative.   Respiratory: Negative.   Cardiovascular: Negative.     Per HPI unless specifically indicated above     Objective:    BP 138/75   Pulse 62   Ht 5\' 9"  (1.753 m)   Wt 172 lb 9.6 oz (78.3 kg)   SpO2 99%   BMI 25.49 kg/m   Wt Readings from Last 3 Encounters:  11/14/16 172 lb 9.6 oz (78.3 kg)  10/18/16 167 lb (75.8 kg)  08/28/16 172 lb 9.6 oz (78.3 kg)    Physical Exam  Constitutional: He is oriented to person, place, and time. He appears well-developed and well-nourished. No distress.  HENT:  Head: Normocephalic and atraumatic.  Right Ear: Hearing normal.  Left Ear: Hearing normal.  Nose: Nose normal.  Eyes: Conjunctivae and lids are normal. Right eye exhibits no discharge. Left eye exhibits no discharge. No scleral icterus.  Cardiovascular: Normal rate, regular rhythm and normal heart sounds.   Pulmonary/Chest: Effort normal and breath sounds normal. No respiratory distress.   Musculoskeletal: Normal range of motion.  Neurological: He is alert and oriented to person, place, and time.  Skin: Skin is intact. No rash noted.  Psychiatric: He has a normal mood and affect. His speech is normal and behavior is normal. Judgment and thought content normal. Cognition and memory are normal.    Results for orders placed or performed in visit on [id number]  Basic Metabolic Panel (BMET)  Result Value Ref Range   Sodium 132 (L) 135 - 145 mEq/L   Potassium 4.2 3.5 - 5.1 mEq/L   Chloride 96 96 - 112 mEq/L   CO2 30 19 - 32 mEq/L   Glucose, Bld 147 (H) 70 - 99 mg/dL   BUN 23 6 - 23 mg/dL   Creatinine, Ser 0.91 0.40 - 1.50 mg/dL   Calcium 9.7 8.4 - 10.5 mg/dL   GFR 86.83 >60.00 mL/min      Assessment & Plan:   Problem List Items Addressed This Visit      Cardiovascular and Mediastinum   Hypertension    Blood pressure doing well with stopping alcohol and decreased weight with good fluid management. Discussed fluid management with patient and will decrease amlodipine to 5 mg. Patient will observe blood pressure to make sure not getting too low or too high. If so will notify us.      Relevant Medications  amLODipine (NORVASC) 5 MG tablet   Other Relevant Orders   Basic metabolic panel     Respiratory   COPD (chronic obstructive pulmonary disease) ([place])    The current medical regimen is effective;  continue present plan and medications.         Other   Elevated alkaline phosphatase level    Recheck today      Relevant Orders   Alkaline phosphatase   Ascites    The current medical regimen is effective;  continue present plan and medications.           Follow up plan: Return in about 6 months (around 05/14/2017) for Physical Exam.

## 2016-11-15 ENCOUNTER — Telehealth: Payer: Self-pay | Admitting: Family Medicine

## 2016-11-15 DIAGNOSIS — J209 Acute bronchitis, unspecified: Secondary | ICD-10-CM

## 2016-11-15 DIAGNOSIS — J440 Chronic obstructive pulmonary disease with acute lower respiratory infection: Principal | ICD-10-CM

## 2016-11-15 LAB — BASIC METABOLIC PANEL
BUN/Creatinine Ratio: 28 — ABNORMAL HIGH (ref 10–24)
BUN: 24 mg/dL (ref 8–27)
CO2: 24 mmol/L (ref 18–29)
CREATININE: 0.85 mg/dL (ref 0.76–1.27)
Calcium: 9.3 mg/dL (ref 8.6–10.2)
Chloride: 94 mmol/L — ABNORMAL LOW (ref 96–106)
GFR calc Af Amer: 100 mL/min/{1.73_m2} (ref >59)
GFR, EST NON AFRICAN AMERICAN: 86 mL/min/{1.73_m2} (ref >59)
GLUCOSE: 141 mg/dL — AB (ref 65–99)
Potassium: 4.2 mmol/L (ref 3.5–5.2)
SODIUM: 132 mmol/L — AB (ref 134–144)

## 2016-11-15 LAB — ALKALINE PHOSPHATASE: Alkaline Phosphatase: 409 IU/L — ABNORMAL HIGH (ref 39–117)

## 2016-11-15 NOTE — Telephone Encounter (Signed)
Phone call Discussed with patient alkaline phosphatase even more elevated sodium still low.  Will get chest x-ray

## 2016-11-16 ENCOUNTER — Ambulatory Visit
Admission: RE | Admit: 2016-11-16 | Discharge: 2016-11-16 | Disposition: A | Discharge status: Home / Self Care | Payer: Medicare Other | Source: Ambulatory Visit | Attending: Family Medicine | Admitting: Family Medicine

## 2016-11-16 ENCOUNTER — Telehealth: Payer: Self-pay

## 2016-11-16 DIAGNOSIS — J9811 Atelectasis: Secondary | ICD-10-CM | POA: Diagnosis not present

## 2016-11-16 DIAGNOSIS — R748 Abnormal levels of other serum enzymes: Secondary | ICD-10-CM

## 2016-11-16 DIAGNOSIS — J440 Chronic obstructive pulmonary disease with acute lower respiratory infection: Secondary | ICD-10-CM | POA: Diagnosis present

## 2016-11-16 DIAGNOSIS — R160 Hepatomegaly, not elsewhere classified: Secondary | ICD-10-CM

## 2016-11-16 DIAGNOSIS — J449 Chronic obstructive pulmonary disease, unspecified: Secondary | ICD-10-CM | POA: Diagnosis not present

## 2016-11-16 DIAGNOSIS — K7031 Alcoholic cirrhosis of liver with ascites: Secondary | ICD-10-CM

## 2016-11-16 DIAGNOSIS — J209 Acute bronchitis, unspecified: Secondary | ICD-10-CM | POA: Insufficient documentation

## 2016-11-16 DIAGNOSIS — I700 Atherosclerosis of aorta: Secondary | ICD-10-CM | POA: Insufficient documentation

## 2016-11-16 NOTE — Telephone Encounter (Signed)
Phone call Discussed chest x-ray report showing COPD which is no surprise but otherwise no significant lesions to explain patient's elevated alkaline phosphatase weight loss symptoms plan Will schedule CT of the abdomen

## 2016-11-27 ENCOUNTER — Ambulatory Visit
Admission: RE | Admit: 2016-11-27 | Discharge: 2016-11-27 | Disposition: A | Discharge status: Home / Self Care | Payer: Medicare Other | Source: Ambulatory Visit | Attending: Family Medicine | Admitting: Family Medicine

## 2016-11-27 DIAGNOSIS — K7460 Unspecified cirrhosis of liver: Secondary | ICD-10-CM | POA: Diagnosis not present

## 2016-11-27 DIAGNOSIS — I700 Atherosclerosis of aorta: Secondary | ICD-10-CM | POA: Insufficient documentation

## 2016-11-27 DIAGNOSIS — K7031 Alcoholic cirrhosis of liver with ascites: Secondary | ICD-10-CM | POA: Insufficient documentation

## 2016-11-27 DIAGNOSIS — R748 Abnormal levels of other serum enzymes: Secondary | ICD-10-CM | POA: Insufficient documentation

## 2016-11-27 DIAGNOSIS — R918 Other nonspecific abnormal finding of lung field: Secondary | ICD-10-CM | POA: Insufficient documentation

## 2016-11-27 DIAGNOSIS — R160 Hepatomegaly, not elsewhere classified: Secondary | ICD-10-CM | POA: Insufficient documentation

## 2016-11-27 DIAGNOSIS — R188 Other ascites: Secondary | ICD-10-CM | POA: Diagnosis not present

## 2016-11-27 MED ORDER — IOPAMIDOL (ISOVUE-300) INJECTION 61%
100.0000 mL | Freq: Once | INTRAVENOUS | Status: AC | PRN
  Administered 2016-11-27: 100 mL via INTRAVENOUS

## 2016-11-28 ENCOUNTER — Telehealth: Payer: Self-pay | Admitting: Family Medicine

## 2016-11-28 DIAGNOSIS — R1900 Intra-abdominal and pelvic swelling, mass and lump, unspecified site: Secondary | ICD-10-CM

## 2016-11-28 NOTE — Telephone Encounter (Signed)
Phone call Discussed with patient CT with ill-defined mass showing which may explain patient's elevating alkaline phosphatase. Discussed Will send patient to cancer Center to further evaluate and decide further workup as necessary.

## 2016-12-04 DIAGNOSIS — H25042 Posterior subcapsular polar age-related cataract, left eye: Secondary | ICD-10-CM | POA: Diagnosis not present

## 2016-12-04 DIAGNOSIS — H2513 Age-related nuclear cataract, bilateral: Secondary | ICD-10-CM | POA: Diagnosis not present

## 2016-12-07 ENCOUNTER — Ambulatory Visit: Payer: Medicare Other | Admitting: Oncology

## 2016-12-11 ENCOUNTER — Telehealth: Payer: Self-pay

## 2016-12-11 NOTE — Progress Notes (Signed)
Letter mailed

## 2016-12-11 NOTE — Telephone Encounter (Signed)
Patient scheduled for 02/08/17, letter mailed with date and time. Let him know if this is not convenient then to reschedule, will need to be 02/06/17 or after.

## 2016-12-11 NOTE — Telephone Encounter (Signed)
-----   Message from [place] sent at 09/12/2016 10:06 AM EST ----- Pt due for 3rd standard twinrix on 02/06/17 (original 08/08/2016)

## 2016-12-12 ENCOUNTER — Encounter (INDEPENDENT_AMBULATORY_CARE_PROVIDER_SITE_OTHER): Payer: Self-pay

## 2016-12-12 ENCOUNTER — Inpatient Hospital Stay: Payer: Medicare Other | Attending: Oncology | Admitting: Oncology

## 2016-12-12 ENCOUNTER — Encounter: Payer: Self-pay | Admitting: Oncology

## 2016-12-12 VITALS — BP 109/72 | HR 79 | Temp 97.4°F | Resp 20 | Ht 69.29 in | Wt 176.9 lb

## 2016-12-12 DIAGNOSIS — R160 Hepatomegaly, not elsewhere classified: Secondary | ICD-10-CM | POA: Diagnosis not present

## 2016-12-12 DIAGNOSIS — R188 Other ascites: Secondary | ICD-10-CM | POA: Diagnosis not present

## 2016-12-12 DIAGNOSIS — Z800 Family history of malignant neoplasm of digestive organs: Secondary | ICD-10-CM | POA: Diagnosis not present

## 2016-12-12 DIAGNOSIS — R634 Abnormal weight loss: Secondary | ICD-10-CM | POA: Diagnosis not present

## 2016-12-12 DIAGNOSIS — I10 Essential (primary) hypertension: Secondary | ICD-10-CM | POA: Diagnosis not present

## 2016-12-12 DIAGNOSIS — I700 Atherosclerosis of aorta: Secondary | ICD-10-CM | POA: Diagnosis not present

## 2016-12-12 DIAGNOSIS — R918 Other nonspecific abnormal finding of lung field: Secondary | ICD-10-CM | POA: Diagnosis not present

## 2016-12-12 DIAGNOSIS — Z79899 Other long term (current) drug therapy: Secondary | ICD-10-CM | POA: Diagnosis not present

## 2016-12-12 DIAGNOSIS — R748 Abnormal levels of other serum enzymes: Secondary | ICD-10-CM | POA: Insufficient documentation

## 2016-12-12 DIAGNOSIS — F17210 Nicotine dependence, cigarettes, uncomplicated: Secondary | ICD-10-CM | POA: Diagnosis not present

## 2016-12-12 DIAGNOSIS — K7460 Unspecified cirrhosis of liver: Secondary | ICD-10-CM | POA: Insufficient documentation

## 2016-12-12 DIAGNOSIS — F1010 Alcohol abuse, uncomplicated: Secondary | ICD-10-CM | POA: Diagnosis not present

## 2016-12-12 DIAGNOSIS — J449 Chronic obstructive pulmonary disease, unspecified: Secondary | ICD-10-CM

## 2016-12-12 NOTE — Progress Notes (Signed)
No complaints at this time.

## 2016-12-12 NOTE — Progress Notes (Signed)
Hematology/Oncology Consult note [facility] Telephone:([phone] [phone]  Patient Care Team: Guadalupe Maple, MD as PCP - General (Family Medicine)   Name of the patient: Richard Whitehead  NO:[id number]  May 02, 1946    Reason for referral- abdominal mass   Referring physician- Dr. Jeananne Rama  Date of visit: 12/12/16   History of presenting illness- patient is a 71 year old male who was found to have elevated alkaline phosphatase level on routine lab testing. Patient has also been found to have cirrhosis that has been attributed to his long-standing alcohol use as no other etiology could be identified in the past. Patient was drinking about 4 beers every day up until September 2017 when he quit drinking altogether.  On 11/14/2016 alkaline phosphatase was elevated at 409. Last 2 values over one year have been 193 and 267 respectively. CBC from September 2017 showed white count of 7.9, H&H of 13.9/40.4 with an MCV of 100 and platelet count of 146. LFTs from September 2017 were within normal limits except for an elevated alkaline phosphatase of 267. Total bilirubin was normal at 0.9. An albumin was low at 2.9. Hepatitis C testing was negative. TSH from September 2017 was normal as well. AFP from September 2017 was normal at 2.5  Patient had a CT abdomen on 11/27/2016 which showed: 1. Cirrhosis with severe ascites. Ill defined increased enhancement/high attenuation in the right hepatic lobe may represent an underlying mass. Recommend MRI for further evaluation. 2. There is an ill defined mass in the left retroperitoneum, to the left of the aorta. The findings are suspicious for neoplasm/malignancy. This could be a large metastatic node. Other shotty nodes are seen in the retroperitoneum, porta hepatis, and gastrohepatic ligament. 3. Nodular density in the lingula. This may represent atelectasis but a pulmonary nodule is not excluded. Recommend a CT scan of  the chest for better evaluation. 4. Increased attenuation in the omentum may be secondary to the ascites. No omental masses identified. 5. Atherosclerosis in the abdominal aorta. 6. Thickening of the left adrenal gland is nonspecific and may represent hyperplasia.   Currently patient reports feeling well overall. He is currently on diuretics for his ascites and sees Dr. Loletha Carrow from [facility] gastroenterology. He is of the opinion that his abdominal distention is from his gas and not ascites.   ECOG PS- 1  Pain scale- 0   Review of systems- Review of Systems  Constitutional: Negative for chills, fever, malaise/fatigue and weight loss.  HENT: Negative for congestion, ear discharge and nosebleeds.   Eyes: Negative for blurred vision.  Respiratory: Negative for cough, hemoptysis, sputum production, shortness of breath and wheezing.   Cardiovascular: Negative for chest pain, palpitations, orthopnea and claudication.  Gastrointestinal: Negative for abdominal pain, blood in stool, constipation, diarrhea, heartburn, melena, nausea and vomiting.  Genitourinary: Negative for dysuria, flank pain, frequency, hematuria and urgency.  Musculoskeletal: Negative for back pain, joint pain and myalgias.  Skin: Negative for rash.  Neurological: Negative for dizziness, tingling, focal weakness, seizures, weakness and headaches.  Endo/Heme/Allergies: Does not bruise/bleed easily.  Psychiatric/Behavioral: Negative for depression and suicidal ideas. The patient does not have insomnia.     Allergies  Allergen Reactions  . Accupril [Quinapril Hcl] Cough    Patient Active Problem List   Diagnosis Date Noted  . Ascites 06/22/2016  . ETOH abuse 06/22/2016  . Hepatomegaly 06/21/2016  . Elevated alkaline phosphatase level 06/02/2015  . COPD (chronic obstructive pulmonary disease) ([place])   . Hypertension  Past Medical History:  Diagnosis Date  . Cirrhosis ([place])   . COPD (chronic obstructive  pulmonary disease) ([place])   . Hyperglycemia   . Hypertension      Past Surgical History:  Procedure Laterality Date  . TONSILLECTOMY    . VASECTOMY      Social History   Social History  . Marital status: Married    Spouse name: June  . Number of children: 3  . Years of education: N/A   Occupational History  . retired, part time builds [employer]    Social History Main Topics  . Smoking status: Current Every Day Smoker    Packs/day: 1.00    Years: 50.00    Types: Cigarettes  . Smokeless tobacco: Never Used     Comment: form given 06-22-26  . Alcohol use No     Comment: quit sept 2017  . Drug use: No  . Sexual activity: Not on file   Other Topics Concern  . Not on file   Social History Narrative  . No narrative on file     Family History  Problem Relation Age of Onset  . Pancreatic cancer Mother     spread to liver; died age 9  . Heart attack Paternal Grandfather   . Colon cancer Neg Hx      Current Outpatient Prescriptions:  .  amLODipine (NORVASC) 5 MG tablet, Take 1 tablet (5 mg total) by mouth daily., Disp: 30 tablet, Rfl: 7 .  Ascorbic Acid (VITAMIN C) 1000 MG tablet, Take 1,000 mg by mouth daily., Disp: , Rfl:  .  B COMPLEX VITAMINS PO, Take 1 capsule by mouth daily., Disp: , Rfl:  .  BIOTIN PO, Take 1 capsule by mouth daily., Disp: , Rfl:  .  carvedilol (COREG) 12.5 MG tablet, Take 1 tablet (12.5 mg total) by mouth 2 (two) times daily with a meal., Disp: 60 tablet, Rfl: 12 .  furosemide (LASIX) 40 MG tablet, Take 1.5 tablets (60 mg total) by mouth daily., Disp: 45 tablet, Rfl: 3 .  MULTIPLE VITAMIN PO, Take 1 capsule by mouth daily., Disp: , Rfl:  .  Multiple Vitamins-Minerals (ZINC PO), Take by mouth as needed., Disp: , Rfl:  .  Omega-3 Fatty Acids (FISH OIL PO), Take 1 capsule by mouth daily., Disp: , Rfl:  .  spironolactone (ALDACTONE) 100 MG tablet, Take 1.5 tablets (150 mg total) by mouth daily., Disp: 45 tablet, Rfl: 3 .  Tiotropium Bromide  Monohydrate (SPIRIVA RESPIMAT) 1.25 MCG/ACT AERS, Inhale 1 puff into the lungs every morning., Disp: 1 Inhaler, Rfl: 12 .  VITAMIN E PO, Take by mouth. 500mg  , one daily, Disp: , Rfl:    Physical exam:  Vitals:   12/12/16 1115  BP: 109/72  Pulse: 79  Resp: 20  Temp: 97.4 F (36.3 C)  TempSrc: Tympanic  Weight: 176 lb 14.7 oz (80.3 kg)  Height: 5' 9.29" (1.76 m)   Physical Exam  Constitutional: He is oriented to person, place, and time and well-developed, well-nourished, and in no distress.  HENT:  Head: Normocephalic and atraumatic.  Eyes: EOM are normal. Pupils are equal, round, and reactive to light.  Neck: Normal range of motion.  Cardiovascular: Normal rate, regular rhythm and normal heart sounds.   Pulmonary/Chest: Effort normal and breath sounds normal.  Abdominal: Bowel sounds are normal.  Firm, distended, ascites +  Neurological: He is alert and oriented to person, place, and time.  Skin: Skin is warm and dry.  CMP Latest Ref Rng & Units 11/14/2016  Glucose 65 - 99 mg/dL 141(H)  BUN 8 - 27 mg/dL 24  Creatinine 0.76 - 1.27 mg/dL 0.85  Sodium 134 - 144 mmol/L 132(L)  Potassium 3.5 - 5.2 mmol/L 4.2  Chloride 96 - 106 mmol/L 94(L)  CO2 18 - 29 mmol/L 24  Calcium 8.6 - 10.2 mg/dL 9.3  Total Protein 6.0 - 8.5 g/dL -  Total Bilirubin 0.0 - 1.2 mg/dL -  Alkaline Phos 39 - 117 IU/L 409(H)  AST 0 - 40 IU/L -  ALT 0 - 44 IU/L -   CBC Latest Ref Rng & Units 06/21/2016  WBC 3.4 - 10.8 x10E3/uL 7.9  Hematocrit 37.5 - 51.0 % 40.4  Platelets 150 - 379 x10E3/uL 146(L)    No images are attached to the encounter.  Dg Chest 2 View  Result Date: 11/16/2016 CLINICAL DATA:  COPD, long-term smoker, questionable 30 pound weight loss over the past 6 months EXAM: CHEST  2 VIEW COMPARISON:  None in PACs FINDINGS: The lungs are adequately inflated. The lung markings are coarse in the right middle lobe and lingula. There are no air bronchograms. There are no discrete masses. There  is no pleural effusion or pneumothorax. There is dense calcification in the wall of the thoracic aorta. The heart and pulmonary vascularity are normal. The bony thorax exhibits no acute abnormality. IMPRESSION: COPD. Atelectasis or fibrosis in the lingula and right middle lobe. No discrete pneumonia. No definite pulmonary parenchymal mass. Thoracic aortic atherosclerosis. If the patient's weight loss remains unexplained, chest CT scanning with contrast would be a useful next imaging step. Electronically Signed   By: David  Martinique M.D.   On: 11/16/2016 08:47   Ct Abdomen Pelvis W Contrast  Result Date: 11/27/2016 CLINICAL DATA:  Weight loss.  Elevated liver enzymes. EXAM: CT ABDOMEN AND PELVIS WITH CONTRAST TECHNIQUE: Multidetector CT imaging of the abdomen and pelvis was performed using the standard protocol following bolus administration of intravenous contrast. CONTRAST:  114mL ISOVUE-300 IOPAMIDOL (ISOVUE-300) INJECTION 61% COMPARISON:  None. FINDINGS: Lower chest: A nodular density is seen in the lingula, incompletely characterized. The visualized portion measures up to 16 mm. No other abnormalities are seen within the lung bases. Hepatobiliary: There is a cirrhotic appearance to the liver with hypertrophy of the left hepatic lobe and an atrophic right hepatic lobe. The gallbladder is not seen. Intra and extrahepatic biliary duct dilatation is identified. The main and left portal veins are well opacified. The right portal vein is difficult to visualize. There is an ill defined rounded region of increased attenuation/ enhancement in the right hepatic lobe as seen on axial image 23 and coronal image 59 measuring up to 4 cm. Pancreas: Unremarkable. No pancreatic ductal dilatation or surrounding inflammatory changes. Spleen: The spleen measures 12 cm in cranial caudal dimension which is borderline. It is mildly heterogeneous with no focal mass. Adrenals/Urinary Tract: The right adrenal gland is normal. There is  mild thickening of the left adrenal gland with increased attenuation surrounding the left adrenal gland. No suspicious renal masses. No hydronephrosis, stone, or other abnormality. No ureteral stones identified. The bladder is unremarkable. Stomach/Bowel: The stomach is poorly distended. Mild prominence of the gastric wall is probably due to poor distention. No focal masses seen. The small bowel is normal. The colon and visualized appendix are normal. Vascular/Lymphatic: Atherosclerosis is seen in the non aneurysmal aorta, iliac vessels, and femoral vessels. Shotty nodes are seen in the gastrohepatic ligament and porta hepatis. There  is ill defined increased attenuation in the left periaortic region measuring 4.6 x 4 cm on axial image 46. This appears to measure at least 9 cm in cranial caudal dimension on coronal images. This may represent an abnormal node but is nonspecific. Reproductive: Prostate is unremarkable. Other: Marked ascites. No free air. Increased attenuation the omentum. No definitive omental caking at this time. The omental findings may be secondary to the ascites. Musculoskeletal: Multilevel Schmorl's nodes. Rounded decreased attenuation along the superior endplate of L3 is likely a Schmorl's node. No definitive metastatic disease. IMPRESSION: 1. Cirrhosis with severe ascites. Ill defined increased enhancement/high attenuation in the right hepatic lobe may represent an underlying mass. Recommend MRI for further evaluation. 2. There is an ill defined mass in the left retroperitoneum, to the left of the aorta. The findings are suspicious for neoplasm/malignancy. This could be a large metastatic node. Other shotty nodes are seen in the retroperitoneum, porta hepatis, and gastrohepatic ligament. 3. Nodular density in the lingula. This may represent atelectasis but a pulmonary nodule is not excluded. Recommend a CT scan of the chest for better evaluation. 4. Increased attenuation in the omentum may be  secondary to the ascites. No omental masses identified. 5. Atherosclerosis in the abdominal aorta. 6. Thickening of the left adrenal gland is nonspecific and may represent hyperplasia. Electronically Signed   By: Dorise Bullion III M.D   On: 11/27/2016 18:15    Assessment and plan- Patient is a 71 y.o. male  who has been referred to [place] for liver mass found on imaging  I discussed the results of the CT abdomen with the patient in detail which shows cirrhosis of the liver as well as severe ascites and an ill-defined mass in the right hepatic lobe measuring up to 4 cm. At this time this is it's not clear if this mass represents hepatocellular carcinoma. I would recommend getting MRI of the abdomen with and without contrast for better evaluation of the mass. If the findings are not consistent with [facility] on MRI patient will need a liver biopsy for definitive diagnosis. He will also need repeat AFP testing. However patient states that he has not had good experience with [place] in the past and is not sure if he wants to continue getting his care here. He would like to get in touch with Dr. Jeananne Rama before proceeding with any workup including MRI. He would like to get his care at [facility]. We will await a phone call from Dr. Rance Muir office and follow up with them by the end of this week if we do not hear from them.   Patient was also found to have a nodular density in the lingula and a pulmonary nodule could not be excluded. CT chest will be needed for follow up which needs to be looked into by his PCP since he does not wish to continue to come here  Elevated alkaline phosphatase- this needs to be evaluated by GI to see if its coming from liver or bone or because of low vitamin D levels  Thank you for this kind referral and the opportunity to participate in the care of this patient   Visit Diagnosis 1. Liver mass, right lobe     Dr. Randa Evens, MD, MPH [facility] at [facility] Pager-  [phone] 12/12/2016 12:53 PM

## 2016-12-13 ENCOUNTER — Telehealth: Payer: Self-pay | Admitting: Family Medicine

## 2016-12-13 DIAGNOSIS — K7031 Alcoholic cirrhosis of liver with ascites: Secondary | ICD-10-CM

## 2016-12-13 DIAGNOSIS — F1010 Alcohol abuse, uncomplicated: Secondary | ICD-10-CM

## 2016-12-13 DIAGNOSIS — I700 Atherosclerosis of aorta: Secondary | ICD-10-CM

## 2016-12-13 DIAGNOSIS — R748 Abnormal levels of other serum enzymes: Secondary | ICD-10-CM

## 2016-12-13 DIAGNOSIS — R918 Other nonspecific abnormal finding of lung field: Secondary | ICD-10-CM

## 2016-12-13 DIAGNOSIS — R160 Hepatomegaly, not elsewhere classified: Secondary | ICD-10-CM

## 2016-12-13 NOTE — Telephone Encounter (Signed)
Patient called requesting help with getting an MRI--but he is needing to have one that his head is not in the machine.  He is hoping to get this scheduled this month if possible.  Thank Dennis Bast Santiago Glad  [phone]

## 2016-12-13 NOTE — Telephone Encounter (Signed)
Per Oncology note:   "I would recommend getting MRI of the abdomen with and without contrast for better evaluation of the mass. If the findings are not consistent with [facility] on MRI patient will need a liver biopsy for definitive diagnosis. He will also need repeat AFP testing. However patient states that he has not had good experience with [place] in the past and is not sure if he wants to continue getting his care here. He would like to get in touch with Dr. Jeananne Rama before proceeding with any workup including MRI. He would like to get his care at [facility]. We will await a phone call from Dr. Rance Muir office and follow up with them by the end of this week if we do not hear from them.   Patient was also found to have a nodular density in the lingula and a pulmonary nodule could not be excluded. CT chest will be needed for follow up which needs to be looked into by his PCP since he does not wish to continue to come here"  Please advise.

## 2016-12-14 NOTE — Telephone Encounter (Signed)
I am happy to arrange for that, but it sounds like he would like to see [facility]... Would he like me to arrange an MRI or would he like me to get him a referral to see [facility] and they can do the MRI there?

## 2016-12-15 ENCOUNTER — Other Ambulatory Visit: Payer: Self-pay | Admitting: Family Medicine

## 2016-12-15 DIAGNOSIS — R918 Other nonspecific abnormal finding of lung field: Secondary | ICD-10-CM | POA: Insufficient documentation

## 2016-12-15 DIAGNOSIS — R160 Hepatomegaly, not elsewhere classified: Secondary | ICD-10-CM | POA: Insufficient documentation

## 2016-12-15 DIAGNOSIS — I700 Atherosclerosis of aorta: Secondary | ICD-10-CM | POA: Insufficient documentation

## 2016-12-15 DIAGNOSIS — I10 Essential (primary) hypertension: Secondary | ICD-10-CM

## 2016-12-15 NOTE — Telephone Encounter (Signed)
Spoke with patient. States that he just does not want to go to [facility]. He would like [place] to place ordres.  He is claustrophobic, would like to go to [facility] in [place] since they have an open MRI machine. Pt would like Monday Morning appt in possible.

## 2016-12-15 NOTE — Telephone Encounter (Signed)
I've put the orders in since Dr. Jeananne Rama is out of town. He is to get an MRI of his belly and a CT of his chest. I've also put in a referral to a new oncologist requesting [facility]- if he would prefer to do [facility], let me know and we can change it. Please help get these ordered ASAP. Thanks!!

## 2016-12-15 NOTE — Telephone Encounter (Signed)
No P.A. Needed for imaging. Order's faxed. Pt aware.

## 2016-12-15 NOTE — Telephone Encounter (Signed)
MRI and CT scheduled for December 25, 2016 @ [place]

## 2016-12-22 ENCOUNTER — Telehealth: Payer: Self-pay | Admitting: Family Medicine

## 2016-12-22 NOTE — Telephone Encounter (Signed)
The number is [phone]. Tried calling again. No answer.

## 2016-12-22 NOTE — Telephone Encounter (Signed)
Tried calling.  An answering services comes up and says "the number you have dialed has been temporarily disconnected."

## 2016-12-22 NOTE — Telephone Encounter (Signed)
Patient is scheduled for scans on Monday they imaging center has questions regarding.  They need some clarification on the orders---  [place]

## 2016-12-22 NOTE — Telephone Encounter (Signed)
Called and spoke with Richard Whitehead at [facility]. She was questioning the orders entered and about doing them together. Richard Whitehead stated that the lung CT ordered they thought was for a lung mass. I read Richard Whitehead the call documentation per the oncology note and that it was a pulmonary nodule and not a mass. Richard Whitehead stated that this would be fine then and that she would document this in the order note.

## 2016-12-25 DIAGNOSIS — K7460 Unspecified cirrhosis of liver: Secondary | ICD-10-CM | POA: Diagnosis not present

## 2016-12-25 DIAGNOSIS — R160 Hepatomegaly, not elsewhere classified: Secondary | ICD-10-CM | POA: Diagnosis not present

## 2016-12-25 DIAGNOSIS — Z87891 Personal history of nicotine dependence: Secondary | ICD-10-CM | POA: Diagnosis not present

## 2016-12-25 DIAGNOSIS — I7090 Unspecified atherosclerosis: Secondary | ICD-10-CM | POA: Diagnosis not present

## 2016-12-25 DIAGNOSIS — J439 Emphysema, unspecified: Secondary | ICD-10-CM | POA: Diagnosis not present

## 2016-12-25 DIAGNOSIS — R188 Other ascites: Secondary | ICD-10-CM | POA: Diagnosis not present

## 2016-12-25 DIAGNOSIS — R918 Other nonspecific abnormal finding of lung field: Secondary | ICD-10-CM | POA: Diagnosis not present

## 2016-12-25 DIAGNOSIS — K838 Other specified diseases of biliary tract: Secondary | ICD-10-CM | POA: Diagnosis not present

## 2016-12-25 DIAGNOSIS — I2510 Atherosclerotic heart disease of native coronary artery without angina pectoris: Secondary | ICD-10-CM | POA: Diagnosis not present

## 2016-12-25 NOTE — Progress Notes (Signed)
Call pt 

## 2016-12-26 ENCOUNTER — Telehealth: Payer: Self-pay | Admitting: Family Medicine

## 2016-12-26 NOTE — Telephone Encounter (Signed)
Pt already on call list. Will call this afternoon.

## 2016-12-26 NOTE — Telephone Encounter (Signed)
Patient called to see if Dr Jeananne Rama could give him a call back regarding his results from yesterday.  Thank You

## 2017-01-02 ENCOUNTER — Telehealth: Payer: Self-pay | Admitting: Gastroenterology

## 2017-01-02 ENCOUNTER — Encounter: Payer: Self-pay | Admitting: Gastroenterology

## 2017-01-02 ENCOUNTER — Ambulatory Visit (INDEPENDENT_AMBULATORY_CARE_PROVIDER_SITE_OTHER): Payer: Medicare Other | Admitting: Gastroenterology

## 2017-01-02 ENCOUNTER — Other Ambulatory Visit (INDEPENDENT_AMBULATORY_CARE_PROVIDER_SITE_OTHER): Payer: Medicare Other

## 2017-01-02 ENCOUNTER — Encounter (INDEPENDENT_AMBULATORY_CARE_PROVIDER_SITE_OTHER): Payer: Self-pay

## 2017-01-02 VITALS — BP 102/48 | HR 76 | Ht 68.5 in | Wt 174.8 lb

## 2017-01-02 DIAGNOSIS — K7031 Alcoholic cirrhosis of liver with ascites: Secondary | ICD-10-CM | POA: Diagnosis not present

## 2017-01-02 DIAGNOSIS — R945 Abnormal results of liver function studies: Secondary | ICD-10-CM

## 2017-01-02 DIAGNOSIS — R7989 Other specified abnormal findings of blood chemistry: Secondary | ICD-10-CM | POA: Diagnosis not present

## 2017-01-02 DIAGNOSIS — R160 Hepatomegaly, not elsewhere classified: Secondary | ICD-10-CM

## 2017-01-02 DIAGNOSIS — R1900 Intra-abdominal and pelvic swelling, mass and lump, unspecified site: Secondary | ICD-10-CM

## 2017-01-02 DIAGNOSIS — R634 Abnormal weight loss: Secondary | ICD-10-CM | POA: Diagnosis not present

## 2017-01-02 DIAGNOSIS — I952 Hypotension due to drugs: Secondary | ICD-10-CM | POA: Diagnosis not present

## 2017-01-02 DIAGNOSIS — R188 Other ascites: Secondary | ICD-10-CM

## 2017-01-02 LAB — CBC WITH DIFFERENTIAL/PLATELET
BASOS ABS: 0.1 10*3/uL (ref 0.0–0.1)
Basophils Relative: 0.8 % (ref 0.0–3.0)
EOS ABS: 0 10*3/uL (ref 0.0–0.7)
Eosinophils Relative: 0.4 % (ref 0.0–5.0)
HEMATOCRIT: 39.8 % (ref 39.0–52.0)
Hemoglobin: 13.8 g/dL (ref 13.0–17.0)
LYMPHS ABS: 0.6 10*3/uL — AB (ref 0.7–4.0)
LYMPHS PCT: 6.3 % — AB (ref 12.0–46.0)
MCHC: 34.7 g/dL (ref 30.0–36.0)
MCV: 101.4 fl — ABNORMAL HIGH (ref 78.0–100.0)
Monocytes Absolute: 0.7 10*3/uL (ref 0.1–1.0)
Monocytes Relative: 7.2 % (ref 3.0–12.0)
NEUTROS ABS: 8.1 10*3/uL — AB (ref 1.4–7.7)
PLATELETS: 183 10*3/uL (ref 150.0–400.0)
RBC: 3.93 Mil/uL — ABNORMAL LOW (ref 4.22–5.81)
RDW: 14.7 % (ref 11.5–15.5)
WBC: 9.5 10*3/uL (ref 4.0–10.5)

## 2017-01-02 LAB — COMPREHENSIVE METABOLIC PANEL
ALT: 38 U/L (ref 0–53)
AST: 38 U/L — AB (ref 0–37)
Albumin: 2.8 g/dL — ABNORMAL LOW (ref 3.5–5.2)
Alkaline Phosphatase: 320 U/L — ABNORMAL HIGH (ref 39–117)
BILIRUBIN TOTAL: 1.1 mg/dL (ref 0.2–1.2)
BUN: 29 mg/dL — ABNORMAL HIGH (ref 6–23)
CO2: 30 meq/L (ref 19–32)
CREATININE: 0.96 mg/dL (ref 0.40–1.50)
Calcium: 9.3 mg/dL (ref 8.4–10.5)
Chloride: 100 mEq/L (ref 96–112)
GFR: 81.57 mL/min (ref >60.00)
GLUCOSE: 103 mg/dL — AB (ref 70–99)
Potassium: 4.4 mEq/L (ref 3.5–5.1)
Sodium: 133 mEq/L — ABNORMAL LOW (ref 135–145)
Total Protein: 7.1 g/dL (ref 6.0–8.3)

## 2017-01-02 LAB — PROTIME-INR
INR: 1.2 ratio — ABNORMAL HIGH (ref 0.8–1.0)
Prothrombin Time: 12.4 s (ref 9.6–13.1)

## 2017-01-02 NOTE — Patient Instructions (Addendum)
Stop taking amlodipine (norvasc)  Get labs done today  I will review your imaging tests with the radiology department and contact you regarding a biopsy.  If you are age 71 or older, your body mass index should be between 23-30. Your Body mass index is 26.19 kg/m. If this is out of the aforementioned range listed, please consider follow up with your Primary Care Provider.  If you are age 73 or younger, your body mass index should be between 19-25. Your Body mass index is 26.19 kg/m. If this is out of the aformentioned range listed, please consider follow up with your Primary Care Provider.   Thank you for choosing [facility]  Dr Wilfrid Lund III

## 2017-01-02 NOTE — Telephone Encounter (Signed)
Dr. Loletha Carrow,  I will be at the tumor board tomorrow, hope we can review his images. I will let you know the recommendations so you may communicate with him about the next step will be. I will be happy to see him, likely next Wednesday 4/4. I am out of office this Friday and next Monday/Tuesday.   Seth Bake, Please arrange his appointment with me.   Truitt Merle MD

## 2017-01-02 NOTE — Progress Notes (Addendum)
Richard Whitehead:  History: Richard Whitehead 01/02/2017  Referring physician: Golden Pop, MD  Reason for consult/chief complaint: Cirrhosis (follow up cirrhosis; + ascites, no SOB)   Subjective  HPI:  Richard Whitehead follows up for the first time in 2 months. As before, he seems a little uncertain of his medicines. At first he said he was not taking spironolactone, then said he was taking "everything you prescribed me". He is concerned because he continued to lose muscle mass despite an increase in abdominal girth, so his PCP ordered a CT scan. This suggested a mass in the right lobe of the liver. He went for an MRI a week ago, and we were eventually able to pull up the results from [facility]. He also brought the scans with him on disc today. The MRI report is found under the imaging section of his Epic chart. There is a several centimeter malignant-appearing mass appearing to involve or arise from the common hepatic duct and growing into the right lobe of the liver. He complains of being lightheaded when he stands, so he is concerned about his low blood pressure.  ROS:  Review of Systems He denies chest pain dyspnea or dysuria  Past Medical History: Past Medical History:  Diagnosis Date  . Allergy   . Cataract   . Cirrhosis ([place])   . COPD (chronic obstructive pulmonary disease) ([place])   . Hyperglycemia   . Hypertension   . Substance abuse   . Ulcer [facility])      Past Surgical History: Past Surgical History:  Procedure Laterality Date  . TONSILLECTOMY    . VASECTOMY       Family History: Family History  Problem Relation Age of Onset  . Pancreatic cancer Mother     spread to liver; died age 77  . Heart attack Paternal Grandfather   . Colon cancer Neg Hx     Social History: Social History   Social History  . Marital status: Married    Spouse name: June  . Number of children: 3  . Years of education: N/A   Occupational History  .  retired, part time builds [employer]    Social History Main Topics  . Smoking status: Current Every Day Smoker    Packs/day: 1.00    Years: 50.00    Types: Cigarettes  . Smokeless tobacco: Never Used     Comment: form given 06-22-16  . Alcohol use No     Comment: quit sept 2017  . Drug use: No  . Sexual activity: Not Asked   Other Topics Concern  . None   Social History Narrative  . None    Allergies: Allergies  Allergen Reactions  . Accupril [Quinapril Hcl] Cough    Outpatient Meds: Current Outpatient Prescriptions  Medication Sig Dispense Refill  . amLODipine (NORVASC) 10 MG tablet TAKE 1 TABLET BY MOUTH ONCE DAILY 30 tablet 0  . amLODipine (NORVASC) 5 MG tablet Take 1 tablet (5 mg total) by mouth daily. 30 tablet 7  . Ascorbic Acid (VITAMIN C) 1000 MG tablet Take 1,000 mg by mouth daily.    . B COMPLEX VITAMINS PO Take 1 capsule by mouth daily.    Richard Whitehead Kitchen BIOTIN PO Take 1 capsule by mouth daily.    . carvedilol (COREG) 12.5 MG tablet Take 1 tablet (12.5 mg total) by mouth 2 (two) times daily with a meal. 60 tablet 12  . furosemide (LASIX) 40 MG tablet Take 1.5 tablets (60 mg total)  by mouth daily. 45 tablet 3  . MULTIPLE VITAMIN PO Take 1 capsule by mouth daily.    . Multiple Vitamins-Minerals (ZINC PO) Take by mouth as needed.    . Omega-3 Fatty Acids (FISH OIL PO) Take 1 capsule by mouth daily.    . Tiotropium Bromide Monohydrate (SPIRIVA RESPIMAT) 1.25 MCG/ACT AERS Inhale 1 puff into the lungs every morning. 1 Inhaler 12  . VITAMIN E PO Take by mouth. 500mg  , one daily     No current facility-administered medications for this visit.       ___________________________________________________________________ Objective   Exam:  BP (!) 102/48   Pulse 76   Ht 5' 8.5" (1.74 m)   Wt 174 lb 12.8 oz (79.3 kg)   BMI 26.19 kg/m    General: this is a(n) Chronically ill-appearing man, no jaundice or icterus. He has poor muscle mass as before and a ruddy complexion    Eyes: sclera anicteric, no redness  ENT: oral mucosa moist without lesions, no cervical or supraclavicular lymphadenopathy, good dentition  CV: RRR without murmur, S1/S2, no JVD, 2+ peripheral edema  Resp: clear to auscultation bilaterally, normal RR and effort noted  GI: soft, no tenderness, with active bowel sounds. Left lobe of the liver is felt 8 fingerbreadths below the costal margin, right lobe of liver is felt on inspiration  Skin; warm and dry, no rash or jaundice noted  Neuro: awake, alert and oriented x 3. Normal gross motor function and fluent speech  Labs:  CMP Latest Ref Rng & Units 11/14/2016 09/26/2016 08/28/2016  Glucose 65 - 99 mg/dL 141(H) 147(H) 150(H)  BUN 8 - 27 mg/dL 24 23 31(H)  Creatinine 0.76 - 1.27 mg/dL 0.85 0.91 0.85  Sodium 134 - 144 mmol/L 132(L) 132(L) 133(L)  Potassium 3.5 - 5.2 mmol/L 4.2 4.2 4.7  Chloride 96 - 106 mmol/L 94(L) 96 95(L)  CO2 18 - 29 mmol/L 24 30 21   Calcium 8.6 - 10.2 mg/dL 9.3 9.7 9.8  Total Protein 6.0 - 8.5 g/dL - - -  Total Bilirubin 0.0 - 1.2 mg/dL - - -  Alkaline Phos 39 - 117 IU/L 409(H) - -  AST 0 - 40 IU/L - - -  ALT 0 - 44 IU/L - - -   Last AFP was 2.5 in September 2017  Radiologic Studies:  CLINICAL DATA:  Weight loss.  Elevated liver enzymes.   EXAM: CT ABDOMEN AND PELVIS WITH CONTRAST   TECHNIQUE: Multidetector CT imaging of the abdomen and pelvis was performed using the standard protocol following bolus administration of intravenous contrast.   CONTRAST:  14mL ISOVUE-300 IOPAMIDOL (ISOVUE-300) INJECTION 61%   COMPARISON:  None.   FINDINGS: Lower chest: A nodular density is seen in the lingula, incompletely characterized. The visualized portion measures up to 16 mm. No other abnormalities are seen within the lung bases.   Hepatobiliary: There is a cirrhotic appearance to the liver with hypertrophy of the left hepatic lobe and an atrophic right hepatic lobe. The gallbladder is not seen. Intra and  extrahepatic biliary duct dilatation is identified. The main and left portal veins are well opacified. The right portal vein is difficult to visualize. There is an ill defined rounded region of increased attenuation/ enhancement in the right hepatic lobe as seen on axial image 23 and coronal image 59 measuring up to 4 cm.   Pancreas: Unremarkable. No pancreatic ductal dilatation or surrounding inflammatory changes.   Spleen: The spleen measures 12 cm in cranial caudal dimension which  is borderline. It is mildly heterogeneous with no focal mass.   Adrenals/Urinary Tract: The right adrenal gland is normal. There is mild thickening of the left adrenal gland with increased attenuation surrounding the left adrenal gland. No suspicious renal masses. No hydronephrosis, stone, or other abnormality. No ureteral stones identified. The bladder is unremarkable.   Stomach/Bowel: The stomach is poorly distended. Mild prominence of the gastric wall is probably due to poor distention. No focal masses seen. The small bowel is normal. The colon and visualized appendix are normal.   Vascular/Lymphatic: Atherosclerosis is seen in the non aneurysmal aorta, iliac vessels, and femoral vessels. Shotty nodes are seen in the gastrohepatic ligament and porta hepatis. There is ill defined increased attenuation in the left periaortic region measuring 4.6 x 4 cm on axial image 46. This appears to measure at least 9 cm in cranial caudal dimension on coronal images. This may represent an abnormal node but is nonspecific.   Reproductive: Prostate is unremarkable.   Other: Marked ascites. No free air. Increased attenuation the omentum. No definitive omental caking at this time. The omental findings may be secondary to the ascites.   Musculoskeletal: Multilevel Schmorl's nodes. Rounded decreased attenuation along the superior endplate of L3 is likely a Schmorl's node. No definitive metastatic disease.     IMPRESSION: 1. Cirrhosis with severe ascites. Ill defined increased enhancement/high attenuation in the right hepatic lobe may represent an underlying mass. Recommend MRI for further evaluation. 2. There is an ill defined mass in the left retroperitoneum, to the left of the aorta. The findings are suspicious for neoplasm/malignancy. This could be a large metastatic node. Other shotty nodes are seen in the retroperitoneum, porta hepatis, and gastrohepatic ligament. 3. Nodular density in the lingula. This may represent atelectasis but a pulmonary nodule is not excluded. Recommend a CT scan of the chest for better evaluation. 4. Increased attenuation in the omentum may be secondary to the ascites. No omental masses identified. 5. Atherosclerosis in the abdominal aorta. 6. Thickening of the left adrenal gland is nonspecific and may represent hyperplasia.     Electronically Signed   By: Dorise Bullion III M.D   On: 11/27/2016 18:15   MRI abdomen 12/25/16 - see report in imaging section (see above)  Assessment: Encounter Diagnoses  Name Primary?  . Alcoholic cirrhosis of liver with ascites ([place]) Yes  . Abnormal loss of weight   . LFTs abnormal   . Liver mass, right lobe   . Hepatomegaly   . Hypotension due to drugs   This is very worrisome for hepatocellular carcinoma.    Plan:  CBC,CMP,INR,AFP  Discontinue Norvasc  I will review his imaging studies with interventional radiology. It may be too late to get him on tumor board for this week.  After imaging review: Probable biopsy unless his AFP is markedly elevated and oncology feels that may be sufficient for diagnosis of HCC.  Total time 45 minutes, over half spent reviewing his imaging and coordinating and the plan with him.  Nelida Meuse III  CC: Golden Pop, MD   Further chart review reveals that this patient has already seen oncology at [facility] on 3/6.  At that time, patient decided to wait on MRI.   It is not  clear to me that Dr Janese Banks from oncology is aware that the MRI has been done or its very worrisome findings. We will alert Dr Janese Banks today.  This patient needs to be presented at tumor board tomorrow and make plan  for biopsy unless they feel imaging/labs definitive for [place].  Then treatment plan needs to be composed.

## 2017-01-02 NOTE — Telephone Encounter (Signed)
Dr Burr Medico,    This patient requires urgent re-evaluation for a malignant-appearing liver mass in the setting of cirrhosis.  You can review my office note from earlier today for details.  I copied you on a message stream with Dr Janese Banks of Oncology and Dr Jeananne Rama, the patient's PCP.    Richard Whitehead wishes to see another oncologist in the [facility] system.    I have sent his recent [facility] imaging studies on disc (which the patient brought to the office today) with one of my partners in hopes they can be reviewed during tumor board this week and plans made for probable biopsy.  The reports themselves are available in the Epic system under imaging tab.     INR and AFP were also drawn today and results pending.  Please let me know if I can provide any additional information.

## 2017-01-03 ENCOUNTER — Inpatient Hospital Stay
Admission: RE | Admit: 2017-01-03 | Discharge: 2017-01-03 | Disposition: A | Discharge status: Home / Self Care | Payer: Self-pay | Source: Ambulatory Visit | Attending: Gastroenterology | Admitting: Gastroenterology

## 2017-01-03 ENCOUNTER — Other Ambulatory Visit: Payer: Self-pay

## 2017-01-03 ENCOUNTER — Other Ambulatory Visit: Payer: Self-pay | Admitting: Gastroenterology

## 2017-01-03 ENCOUNTER — Encounter: Payer: Self-pay | Admitting: Oncology

## 2017-01-03 DIAGNOSIS — C801 Malignant (primary) neoplasm, unspecified: Secondary | ICD-10-CM

## 2017-01-03 DIAGNOSIS — R1900 Intra-abdominal and pelvic swelling, mass and lump, unspecified site: Secondary | ICD-10-CM

## 2017-01-03 DIAGNOSIS — K7031 Alcoholic cirrhosis of liver with ascites: Secondary | ICD-10-CM

## 2017-01-03 LAB — AFP TUMOR MARKER: AFP-Tumor Marker: 2.2 ng/mL (ref <6.1)

## 2017-01-03 NOTE — Telephone Encounter (Signed)
Richard Whitehead, the tumor board this morning recommend IR biopsy, please schedule him to see me after his biopsy. Thanks   Truitt Merle MD

## 2017-01-03 NOTE — Telephone Encounter (Signed)
Richard Whitehead,    Please send an order to interventional radiology today and call to arrange therapeutic paracentesis AND biopsy of retroperitoneal mass  (at the same time) for early next week.  As you can see, he will be seeing Dr Burr Medico from Oncology on 4/5.  Please put the order to the attention of Dr Pascal Lux at his request because he was at tumor board this morning.  I spoke to Richard Whitehead just now about the plan, and he has already heard from oncology about his appointment with them.

## 2017-01-03 NOTE — Telephone Encounter (Signed)
Spoke to [place] at central scheduling who does IR scheduling. She will send order to Dr. Pascal Lux for his review and they will schedule patient.

## 2017-01-08 ENCOUNTER — Telehealth: Payer: Self-pay | Admitting: Hematology

## 2017-01-08 ENCOUNTER — Telehealth: Payer: Self-pay | Admitting: Family Medicine

## 2017-01-08 NOTE — Telephone Encounter (Signed)
IR called this am and needs the orders for [place] and biopsy changed.  Radiology wants to do a CT biopsy for retroperitoneal mass and needs orders placed for [place].  New orders placed.

## 2017-01-08 NOTE — Telephone Encounter (Signed)
Per Dr. Burr Medico wanted the pt's appt to be rescheduled until after the bx. Appt has been rescheduled to 4/19 at 11am. Pt's wife stated that he prefers morning appts. Agreed to the appt date and time.

## 2017-01-08 NOTE — Telephone Encounter (Signed)
Please call patient back and notify him we did not call him.  It could have been Santina Evans or Oncology.

## 2017-01-08 NOTE — Telephone Encounter (Signed)
Patient said someone called him but did not leave a message or a name to call back.  He said it was ok to leave a message if he did not answer.  Thanks

## 2017-01-08 NOTE — Addendum Note (Signed)
Addended by: Marlon Pel on: 01/08/2017 08:48 AM   Modules accepted: Orders

## 2017-01-09 NOTE — Telephone Encounter (Signed)
----- Message from Doran Stabler, MD sent at 01/02/2017  5:03 PM EDT -----  Dr Burr Medico,    I hope you can help with this patient.  As you will see from the message stream below, this is a time-sensitive matter of needing imaging review and plan for probable biopsy of this liver mass.  Perhaps if the AFP comes back very high you will not feel the need for biopsy.  I apologize for the short notice.  There appears to have been some delay in workup and plan because the patient had considered getting an evaluation at [facility].  He is now agreeable to receiving his care at [facility] but does not want to see Dr Janese Banks.  I do not know how to be helpful at this point because this needs a tumor board review and decision on biopsy.  I would also like to get a plan before I go out of town later this week.  Please let me know if we can provide any more information.  - [facility]  ----- Message ----- From: Guadalupe Maple, MD Sent: 01/02/2017   4:39 PM To: Doran Stabler, MD, Sindy Guadeloupe, MD  Dr. Loletha Carrow, I just spoke with Sonia Side, he is happy for you to manage his case through lower and [facility]. He is for whatever reasons not wanting to go to [facility] and Dr. Janese Banks. Dr. Loletha Carrow I will be out of the office for the rest of the week. [place] is expecting you to contact him. Chisum Habenicht ----- Message ----- From: Doran Stabler, MD Sent: 01/02/2017   1:13 PM To: Guadalupe Maple, MD, Sindy Guadeloupe, MD  Dr Janese Banks,    I feel this man should be presented at tumor board tomorrow because this mass is going to cause a high grade biliary obstruction soon if nothing is done, and Cheng needs a multi-disciplinary approach to his complex case.   Dr Jeananne Rama,    If there is a plan for Guinn to see oncology at another institution, I can find no record of it in his Epic chart.  [place] made no mention of that to me today.   Based on our discussion today, Murrell seems agreeable to receiving care at [facility], which is  good since he needs a diagnosis and plan right away.       I am sending this message to facilitate communication between the two of you.  Dr Jeananne Rama, because he knows you well and trusts you.  Dr Janese Banks, because you are in a position to help this patient if he will accept it.    - Wilfrid Lund  ----- Message ----- From: Sindy Guadeloupe, MD Sent: 01/02/2017   1:08 PM To: Doran Stabler, MD  Hello Dr. Loletha Carrow,  Patient saw me once and does not wish to follow up with me. He wants to see Oncology at [facility] I believe and his PCP is looking into alternate referral. Can you please touch base with his PCP and see what the patient wants at this point?  Thank you, Astrid Divine   ----- Message ----- From: Doran Stabler, MD Sent: 01/02/2017  12:46 PM To: Truitt Merle, MD, Sindy Guadeloupe, MD  Dr Janese Banks,    This is Wilfrid Lund from [facility].  Please see my note from today on this patient.  You saw him 3/6.  He is cirrhotic and most likely has [place] based on an MRI  done at [facility].    Patient brought scans to office today on disc.  I gave them to Oretha Caprice in hopes they can be looked at during tumor board tomorrow for a plan on whether or not Bx needed and what treatment he should receive. I sent him to the lab today for CBC/INR/CMP and AFP.  Thank you.  Mallie Mussel

## 2017-01-09 NOTE — Telephone Encounter (Signed)
Phone call Discussed with patient patient now plugged into the system has appointments for biopsy and follow-up scheduled

## 2017-01-11 ENCOUNTER — Ambulatory Visit: Payer: Medicare Other | Admitting: Hematology

## 2017-01-17 ENCOUNTER — Other Ambulatory Visit: Payer: Self-pay | Admitting: General Surgery

## 2017-01-17 ENCOUNTER — Other Ambulatory Visit: Payer: Self-pay | Admitting: Radiology

## 2017-01-18 ENCOUNTER — Ambulatory Visit (HOSPITAL_COMMUNITY)
Admission: RE | Admit: 2017-01-18 | Discharge: 2017-01-18 | Disposition: A | Discharge status: Home / Self Care | Payer: Medicare Other | Source: Ambulatory Visit | Attending: Gastroenterology | Admitting: Gastroenterology

## 2017-01-18 ENCOUNTER — Encounter (HOSPITAL_COMMUNITY): Payer: Self-pay

## 2017-01-18 DIAGNOSIS — R590 Localized enlarged lymph nodes: Secondary | ICD-10-CM | POA: Diagnosis not present

## 2017-01-18 DIAGNOSIS — R188 Other ascites: Secondary | ICD-10-CM

## 2017-01-18 DIAGNOSIS — R1900 Intra-abdominal and pelvic swelling, mass and lump, unspecified site: Secondary | ICD-10-CM | POA: Diagnosis not present

## 2017-01-18 LAB — PROTIME-INR
INR: 1.07
PROTHROMBIN TIME: 14 s (ref 11.4–15.2)

## 2017-01-18 LAB — CBC
HEMATOCRIT: 40 % (ref 39.0–52.0)
Hemoglobin: 14 g/dL (ref 13.0–17.0)
MCH: 35.3 pg — ABNORMAL HIGH (ref 26.0–34.0)
MCHC: 35 g/dL (ref 30.0–36.0)
MCV: 100.8 fL — ABNORMAL HIGH (ref 78.0–100.0)
Platelets: 157 10*3/uL (ref 150–400)
RBC: 3.97 MIL/uL — ABNORMAL LOW (ref 4.22–5.81)
RDW: 14.7 % (ref 11.5–15.5)
WBC: 7.8 10*3/uL (ref 4.0–10.5)

## 2017-01-18 LAB — APTT: APTT: 35 s (ref 24–36)

## 2017-01-18 MED ORDER — NALOXONE HCL 0.4 MG/ML IJ SOLN
INTRAMUSCULAR | Status: AC
  Filled 2017-01-18: qty 1

## 2017-01-18 MED ORDER — FLUMAZENIL 0.5 MG/5ML IV SOLN
INTRAVENOUS | Status: AC
  Filled 2017-01-18: qty 5

## 2017-01-18 MED ORDER — MIDAZOLAM HCL 2 MG/2ML IJ SOLN
INTRAMUSCULAR | Status: AC
  Filled 2017-01-18: qty 6

## 2017-01-18 MED ORDER — SODIUM CHLORIDE 0.9 % IV SOLN
INTRAVENOUS | Status: DC
  Administered 2017-01-18: 08:00:00 via INTRAVENOUS

## 2017-01-18 MED ORDER — FENTANYL CITRATE (PF) 100 MCG/2ML IJ SOLN
INTRAMUSCULAR | Status: AC
  Filled 2017-01-18: qty 6

## 2017-01-18 NOTE — Progress Notes (Signed)
Patient returned from IR via bed demanding to "get dressed and get out of here".  Discussed with patient need and order for bedrest until noon today.  Patient states he is getting dressed and leaving.  Rn again expalined importance of bedrest after procedure.  Patient and wife verbalized understanding however patient states he is leaving.  RN notified Richard Robert, PA as to patient leaving AMA.  Richard Whitehead consulted Richard Whitehead and returned call to RN Richard Whitehead would prefer patient remain on bedrest until noon however if he is addiment about leaving he needs to sign AMA form.  RN discussed again risks of leaving, need to sign AMA form.  Patient agreed to stay until noon however demanded PIV be removed at once.  RN explain need for PIV to remain intact until just before discharge.  Patient verbalized understanding and again demanded PIV removal.  PIV fluids stopped and PIV removed.  Discharge instructions reviewed with patient and wife with no issues noted.  Patient continues to request getting out of bed to get dressed or walk around the room.  RN again explained need for bedrest.  Patient verbalized understanding and agreed to remain in the bed "for now".  Wife verbalized understanding and provided emotional support to patient.

## 2017-01-18 NOTE — Sedation Documentation (Signed)
No sedation medication given.

## 2017-01-18 NOTE — Sedation Documentation (Signed)
Pt declines to have any sedation medication. Dr. Annamaria Boots aware.

## 2017-01-18 NOTE — Procedures (Signed)
Ultrasound-guided  therapeutic paracentesis performed yielding 4.6 liters of yellow fluid. No immediate complications.

## 2017-01-18 NOTE — Procedures (Signed)
retroperiotneal adenopathy  s/p LEFT RP ADENOPATHY CORE BX  No comp Stable EBL 5CC Path pending Full report in PACS

## 2017-01-18 NOTE — Progress Notes (Signed)
RN entered patients room for vital signs.  Patient standing beside the bed completely dressed in street clothes.  Patient sits back on the side of the bed.  RN again explained purpose of bedrest until noon.  Patient states "I am not out of the bed".

## 2017-01-18 NOTE — H&P (Signed)
Referring Physician(s): Nelida Meuse III  Supervising Physician: Daryll Brod  Patient Status:  [facility] OP  Chief Complaint:  "I'm having a biopsy"  Subjective: Patient familiar to IR service from prior paracentesis in 2017. He has a history of cirrhosis, ascites as well as liver and retroperitoneal masses of unknown etiology. LFTs are elevated and AFP is normal. He presents today for paracentesis with image guided left retroperitoneal mass biopsy for further evaluation.He currently denies fever, headache, chest pain, dyspnea, significant abdominal pain, back pain, nausea, vomiting or abnormal bleeding. He does have occasional cough and continues to smoke. Past Medical History:  Diagnosis Date  . Allergy   . Cataract   . Cirrhosis ([place])   . COPD (chronic obstructive pulmonary disease) ([place])   . Hyperglycemia   . Hypertension   . Substance abuse   . Ulcer [facility])    Past Surgical History:  Procedure Laterality Date  . TONSILLECTOMY    . VASECTOMY        Allergies: Accupril [quinapril hcl]  Medications: Prior to Admission medications   Medication Sig Start Date End Date Taking? Authorizing Provider  amLODipine (NORVASC) 10 MG tablet TAKE 1 TABLET BY MOUTH ONCE DAILY 12/15/16   Volney American, PA-C  amLODipine (NORVASC) 5 MG tablet Take 1 tablet (5 mg total) by mouth daily. 11/14/16   Guadalupe Maple, MD  Ascorbic Acid (VITAMIN C) 1000 MG tablet Take 1,000 mg by mouth daily.    Historical Provider, MD  B COMPLEX VITAMINS PO Take 1 capsule by mouth daily.    Historical Provider, MD  BIOTIN PO Take 1 capsule by mouth daily.    Historical Provider, MD  carvedilol (COREG) 12.5 MG tablet Take 1 tablet (12.5 mg total) by mouth 2 (two) times daily with a meal. 06/21/16   Guadalupe Maple, MD  furosemide (LASIX) 40 MG tablet Take 1.5 tablets (60 mg total) by mouth daily. 08/08/16   Nelida Meuse III, MD  MULTIPLE VITAMIN PO Take 1 capsule by mouth daily.    Historical Provider, MD   Multiple Vitamins-Minerals (ZINC PO) Take by mouth as needed.    Historical Provider, MD  naproxen sodium (ANAPROX) 220 MG tablet Take 220 mg by mouth 2 (two) times daily with a meal.    Historical Provider, MD  Omega-3 Fatty Acids (FISH OIL PO) Take 1 capsule by mouth daily.    Historical Provider, MD  Tiotropium Bromide Monohydrate (SPIRIVA RESPIMAT) 1.25 MCG/ACT AERS Inhale 1 puff into the lungs every morning. 02/15/16   Guadalupe Maple, MD  VITAMIN E PO Take by mouth. 500mg  , one daily    Historical Provider, MD     Vital Signs: BP 118/72, heart rate 57, temp 97.5, respirations 18, O2 sat 100% RA   Physical Exam Awake, alert. Chest with distant breath sounds bilaterally. Heart slightly bradycardic rhythm, occ ectopy; Abdomen mild to moderately distended, soft, nontender, positive bowel sounds. No lower extremity edema.  Imaging: No results found.  Labs:  CBC:  Recent Labs  06/21/16 1022 01/02/17 1051 01/18/17 0716  WBC 7.9 9.5 7.8  HGB  --  13.8 14.0  HCT 40.4 39.8 40.0  PLT 146* 183.0 157    COAGS:  Recent Labs  06/22/16 1109 01/02/17 1051 01/18/17 0716  INR 1.3* 1.2* 1.07  APTT  --   --  35    BMP:  Recent Labs  06/21/16 1022  08/28/16 0826 09/26/16 0921 11/14/16 0935 01/02/17 1051  NA 135  < >  133* 132* 132* 133*  K 3.9  < > 4.7 4.2 4.2 4.4  CL 93*  < > 95* 96 94* 100  CO2 27  < > 21 30 24 30   GLUCOSE 196*  < > 150* 147* 141* 103*  BUN 14  < > 31* 23 24 29*  CALCIUM 9.4  < > 9.8 9.7 9.3 9.3  CREATININE 0.84  < > 0.85 0.91 0.85 0.96  GFRNONAA 87  --  87  --  86  --   GFRAA 101  --  101  --  100  --   < > = values in this interval not displayed.  LIVER FUNCTION TESTS:  Recent Labs  06/21/16 1022 11/14/16 0935 01/02/17 1051  BILITOT 0.9  --  1.1  AST 30  --  38*  ALT 22  --  38  ALKPHOS 267* 409* 320*  PROT 6.8  --  7.1  ALBUMIN 2.9*  --  2.8*    Assessment and Plan: Pt with history of cirrhosis, ascites as well as liver and  retroperitoneal masses of unknown etiology. LFTs are elevated and AFP is normal. He presents today for paracentesis with image guided left retroperitoneal mass biopsy for further evaluation.Risks and benefits discussed with the patient including, but not limited to bleeding, infection, damage to adjacent structures or low yield requiring additional tests.All of the patient's questions were answered, patient is agreeable to proceed. Consent signed and in chart.     Electronically Signed: D. Rowe Robert 01/18/2017, 8:24 AM   I spent a total of 20 minutes at the the patient's bedside AND on the patient's hospital floor or unit, greater than 50% of which was counseling/coordinating care for image guided left retroperitoneal mass biopsy

## 2017-01-18 NOTE — Discharge Instructions (Signed)
Paracentesis, Care After Refer to this sheet in the next few weeks. These instructions provide you with information about caring for yourself after your procedure. Your health care provider may also give you more specific instructions. Your treatment has been planned according to current medical practices, but problems sometimes occur. Call your health care provider if you have any problems or questions after your procedure. What can I expect after the procedure? After your procedure, it is common to have a small amount of clear fluid coming from the puncture site. Follow these instructions at home:  Return to your normal activities as told by your health care provider. Ask your health care provider what activities are safe for you.  Take over-the-counter and prescription medicines only as told by your health care provider.  Do not take baths, swim, or use a hot tub until your health care provider approves.  Follow instructions from your health care provider about:  How to take care of your puncture site.  When and how you should change your bandage (dressing).  When you should remove your dressing.  Check your puncture area every day signs of infection. Watch for:  Redness, swelling, or pain.  Fluid, blood, or pus.  Keep all follow-up visits as told by your health care provider. This is important. Contact a health care provider if:  You have redness, swelling, or pain at your puncture site.  You start to have more clear fluid coming from your puncture site.  You have blood or pus coming from your puncture site.  You have chills.  You have a fever. Get help right away if:  You develop chest pain or shortness of breath.  You develop increasing pain, discomfort, or swelling in your abdomen.  You feel dizzy or light-headed or you pass out. This information is not intended to replace advice given to you by your health care provider. Make sure you discuss any questions you  have with your health care provider. Document Released: 02/09/2015 Document Revised: 03/02/2016 Document Reviewed: 12/08/2014 Elsevier Interactive Patient Education  2017 Elsevier Inc[employer]  Needle Biopsy, Care After These instructions give you information about caring for yourself after your procedure. Your doctor may also give you more specific instructions. Call your doctor if you have any problems or questions after your procedure. Follow these instructions at home:  Rest as told by your doctor.  Take medicines only as told by your doctor.  There are many different ways to close and cover the biopsy site, including stitches (sutures), skin glue, and adhesive strips. Follow instructions from your doctor about:  How to take care of your biopsy site.  When and how you should change your bandage (dressing).  When you should remove your dressing.  Removing whatever was used to close your biopsy site.  Check your biopsy site every day for signs of infection. Watch for:  Redness, swelling, or pain.  Fluid, blood, or pus. Contact a doctor if:  You have a fever.  You have redness, swelling, or pain at the biopsy site, and it lasts longer than a few days.  You have fluid, blood, or pus coming from the biopsy site.  You feel sick to your stomach (nauseous).  You throw up (vomit). Get help right away if:  You are short of breath.  You have trouble breathing.  Your chest hurts.  You feel dizzy or you pass out (faint).  You have bleeding that does not stop with pressure or a bandage.  You cough up  up blood. °· Your belly (abdomen) hurts. °This information is not intended to replace advice given to you by your health care provider. Make sure you discuss any questions you have with your health care provider. °Document Released: 09/07/2008 Document Revised: 03/02/2016 Document Reviewed: 09/21/2014 °Elsevier Interactive Patient Education © 2017 Elsevier Inc. ° °

## 2017-01-23 ENCOUNTER — Telehealth: Payer: Self-pay | Admitting: Gastroenterology

## 2017-01-23 NOTE — Telephone Encounter (Signed)
Dr. Pascal Lux, could you take a look to see if you can biopsy the main liver mass? I will let pt know when I see him on 4/19. Thanks.   Truitt Merle MD

## 2017-01-23 NOTE — Telephone Encounter (Signed)
Spoke to patient, let him know that Dr. Loletha Carrow is the hospital physician this week and I am not sure if he has had a chance to review the results yet. Patient was anxious to know prior to his appointment with Dr. Burr Medico on 4/19. I told him I would send a message to Dr. Loletha Carrow.

## 2017-01-23 NOTE — Telephone Encounter (Signed)
I know he had a paracentesis for relief of ascites, but that fluid did not need to be sent for any specimens.  I suspect he means the biopsy he had last week.  Dr Pascal Lux biopsied an area that was suspicious in appearance on CT scan.  I explained to him before he saw radiology that they went after that area because it was suspected to be an area of spread from the liver mass and was an easier spot to access.  However, the pathologist says it is normal lymph node tissue.  Since we still have a strong suspicion of a malignant liver tumor, it seems they will have to try for a biopsy of the liver mass in order to make a diagnosis.  I do not know the technical considerations of timing of that.  I expect Dr Burr Medico can address further and coordinate with Dr Pascal Lux of radiology.  Sorry we do not have a more definitive answer yet, but I am afraid that sometimes happens in a complicated scenario like this.

## 2017-01-25 ENCOUNTER — Telehealth: Payer: Self-pay | Admitting: Hematology

## 2017-01-25 ENCOUNTER — Ambulatory Visit (HOSPITAL_BASED_OUTPATIENT_CLINIC_OR_DEPARTMENT_OTHER): Payer: Medicare Other | Admitting: Hematology

## 2017-01-25 ENCOUNTER — Encounter: Payer: Self-pay | Admitting: Hematology

## 2017-01-25 ENCOUNTER — Ambulatory Visit (HOSPITAL_BASED_OUTPATIENT_CLINIC_OR_DEPARTMENT_OTHER): Payer: Medicare Other

## 2017-01-25 DIAGNOSIS — I10 Essential (primary) hypertension: Secondary | ICD-10-CM | POA: Diagnosis not present

## 2017-01-25 DIAGNOSIS — C221 Intrahepatic bile duct carcinoma: Secondary | ICD-10-CM

## 2017-01-25 DIAGNOSIS — Z720 Tobacco use: Secondary | ICD-10-CM

## 2017-01-25 DIAGNOSIS — R160 Hepatomegaly, not elsewhere classified: Secondary | ICD-10-CM

## 2017-01-25 DIAGNOSIS — K7031 Alcoholic cirrhosis of liver with ascites: Secondary | ICD-10-CM

## 2017-01-25 DIAGNOSIS — Z808 Family history of malignant neoplasm of other organs or systems: Secondary | ICD-10-CM

## 2017-01-25 DIAGNOSIS — Z800 Family history of malignant neoplasm of digestive organs: Secondary | ICD-10-CM | POA: Diagnosis not present

## 2017-01-25 HISTORY — DX: Intrahepatic bile duct carcinoma: C22.1

## 2017-01-25 LAB — COMPREHENSIVE METABOLIC PANEL
ALT: 47 U/L (ref 0–55)
AST: 48 U/L — ABNORMAL HIGH (ref 5–34)
Albumin: 2.4 g/dL — ABNORMAL LOW (ref 3.5–5.0)
Alkaline Phosphatase: 447 U/L — ABNORMAL HIGH (ref 40–150)
Anion Gap: 8 mEq/L (ref 3–11)
BUN: 30.1 mg/dL — AB (ref 7.0–26.0)
CALCIUM: 9.5 mg/dL (ref 8.4–10.4)
CHLORIDE: 100 meq/L (ref 98–109)
CO2: 27 mEq/L (ref 22–29)
CREATININE: 1 mg/dL (ref 0.7–1.3)
EGFR: 78 mL/min/{1.73_m2} — ABNORMAL LOW (ref >90)
GLUCOSE: 95 mg/dL (ref 70–140)
Potassium: 4.6 mEq/L (ref 3.5–5.1)
SODIUM: 135 meq/L — AB (ref 136–145)
TOTAL PROTEIN: 7.3 g/dL (ref 6.4–8.3)
Total Bilirubin: 1.35 mg/dL — ABNORMAL HIGH (ref 0.20–1.20)

## 2017-01-25 NOTE — Progress Notes (Signed)
[facility]  Telephone:([phone]) [phone] [phone]) [phone]  Clinic New Consult Note   Patient Care Team: Guadalupe Maple, MD as PCP - General (Family Medicine) 01/25/2017   REFERRAL PHYSICIAN: Dr. Loletha Carrow   CHIEF COMPLAINTS/PURPOSE OF CONSULTATION:  Liver mass   HISTORY OF PRESENTING ILLNESS (01/25/17):  Richard Whitehead 71 y.o. male with PMH of liver cirrhosis and ascites,  is here because of his recent finding of a liver mass on images. He was referred by his gastroenterologist Dr. Simona Huh. He presents to my clinic with his wife today. He was previously seen by medical oncologist Dr. Janese Banks at [facility]. He is here for a second opinion.   The patient reports he initially noticed edema and abdominal distention and lew extremities edema in August/September of 2017. The patient was initially evaluated by his PCP Dr. Jeananne Rama and ultrasound of abdomen showed liver cirrhosis and ascites. He was referred to GI Dr. Loletha Carrow, and had paracentesis on 07/14/2016, cytology was negative. Patient reports he was placed on Lasix 40 mg daily, and spironolactone 200 mg daily. His ascites has improved and his lack of edema resolved subsequently. His workup for liver cirrhosis was negative, he has long standing history of alcohol drinking, and his liver cirrhosis was felt to be secondary to alcohol. He declined endoscopy for Barrett's screening.  He was noticed to have elevated alkaline phosphatase and liver enzymes during his follow-up in February 2018, a CT of abdomen and pelvis without contrast was ordered, which showed a suspicious liver mass in the right lobe, and a enlarged left retroperitoneal lymph nodes to the left of the aorta. This letter to MRI of the abdomen, which was done on 12/25/2016, which showed and showed a a hypoenhancing mass centered at the common hepatic duct bifurcation resulting in moderate intrahepatic and extra hepatic biliary ductal dilation. CT chest was negative for  metastasis. His case was presented in our GI tumor Board by his gastroenterologist Dr. Loletha Carrow, and would recommends biopsy of his left periaortic lymph node, which was negative for malignant cells.  He reports low blood pressure recently and feelings of dizziness when standing up suddenly. He reports a good energy level; he reports "I'm always going." He naps occasionally as needed. He denies nausea. He denies any recent lower extremity edema; this is significantly improved in the last few months. He reports normal bowel movements; he denies hematochezia. The patient underwent paracentesis on 07/14/16 and reports he had significant relief of abdominal pressure afterwards.   He reports he is on a low sodium diet per Dr. Loletha Carrow, but he wants to stop this.  The patient is taking several medications currently, including Lasix and Coreg.  The patient reports he stopped drinking in September 2017. He continues to smoke, and reports he does not plan to quit.  MEDICAL HISTORY:  Past Medical History:  Diagnosis Date  . Allergy   . Cataract   . 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 Cirrhosis ([place])   . COPD (chronic obstructive pulmonary disease) ([place])   . Hyperglycemia   . Hypertension   . Substance abuse   . Ulcer     SURGICAL HISTORY: Past Surgical History:  Procedure Laterality Date  . TONSILLECTOMY    . VASECTOMY      SOCIAL HISTORY: Social History   Social History  . Marital status: Married    Spouse name: June  . Number of children: 3  . Years of education: N/A   Occupational History  . retired, part  time builds furniture    Social History Main Topics  . Smoking status: Current Every Day Smoker    Packs/day: 1.00    Years: 50.00    Types: Cigarettes  . Smokeless tobacco: Never Used     Comment: form given 06-22-16  . Alcohol use No     Comment: quit sept 2017  . Drug use: No  . Sexual activity: Not on file   Other Topics Concern  . Not on file   Social History  Narrative  . 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 He stopped drinking in September 2017. He continues to smoke.  FAMILY HISTORY: Family History  Problem Relation Age of Onset  . Pancreatic cancer Mother     spread to liver; died age 71  . Cancer Mother 69    liver cancer   . Heart attack Paternal Grandfather   . Colon cancer Neg Hx     ALLERGIES:  is allergic to accupril [quinapril hcl].  MEDICATIONS:  Current Outpatient Prescriptions  Medication Sig Dispense Refill  . Ascorbic Acid (VITAMIN C) 1000 MG tablet Take 1,000 mg by mouth daily.    . B COMPLEX VITAMINS PO Take 1 capsule by mouth daily.    Marland Kitchen BIOTIN PO Take 1 capsule by mouth daily.    . carvedilol (COREG) 12.5 MG tablet Take 1 tablet (12.5 mg total) by mouth 2 (two) times daily with a meal. 60 tablet 12  . furosemide (LASIX) 40 MG tablet Take 40 mg by mouth daily.    . MULTIPLE VITAMIN PO Take 1 capsule by mouth daily.    . Omega-3 Fatty Acids (FISH OIL PO) Take 1 capsule by mouth daily.    Marland Kitchen spironolactone (ALDACTONE) 100 MG tablet Take 100 mg by mouth daily.    . Tiotropium Bromide Monohydrate (SPIRIVA RESPIMAT) 1.25 MCG/ACT AERS Inhale 1 puff into the lungs every morning. 1 Inhaler 12  . VITAMIN E PO Take by mouth. 500mg  , one daily    . Zinc 100 MG TABS Take by mouth.     No current facility-administered medications for this visit.     REVIEW OF SYSTEMS:   Constitutional: Denies fevers, chills or abnormal night sweats, (+) dizziness and lightheadedness when standing suddenly, (+) good energy level Eyes: Denies blurriness of vision, double vision or watery eyes Ears, nose, mouth, throat, and face: Denies mucositis or sore throat Respiratory: Denies cough, dyspnea or wheezes Cardiovascular: Denies palpitation, chest discomfort or lower extremity swelling Gastrointestinal:  Denies nausea, heartburn or change in bowel habits, denies  hematochezia Skin: Denies abnormal skin rashes Lymphatics: Denies new lymphadenopathy or easy bruising Extremities: (+) Reports decreased lower extremity edema recently Neurological:Denies numbness, tingling or new weaknesses Behavioral/Psych: Mood is stable, no new changes  All other systems were reviewed with the patient and are negative.  PHYSICAL EXAMINATION:  ECOG PERFORMANCE STATUS: 1 - Symptomatic but completely ambulatory  Vitals:   01/25/17 1123  BP: (!) 115/58  Pulse: 65  Resp: 18  Temp: 98.5 F (36.9 C)   Filed Weights   01/25/17 1123  Weight: 161 lb 14.4 oz (73.4 kg)    GENERAL:alert, no distress and comfortable. SKIN: skin color, texture, turgor are normal, no rashes or significant lesions EYES: normal, conjunctiva are pink and non-injected, sclera clear OROPHARYNX: no exudate, no erythema and lips, buccal mucosa, and tongue normal  NECK: supple, thyroid normal size, non-tender, without nodularity LYMPH:  no palpable lymphadenopathy in the cervical, axillary or inguinal LUNGS: clear to auscultation with normal breathing effort HEART: regular rate & rhythm and no murmurs ABDOMEN: abdomen non-tender with normal bowel sounds; liver is palpable; mild erythema noted across abdomen Extremities: no lower extremity edema Musculoskeletal: no cyanosis of digits and no clubbing  PSYCH: alert & oriented x 3 with fluent speech NEURO: no focal motor/sensory deficits  LABORATORY DATA:  I have reviewed the data as listed CBC Latest Ref Rng & Units 01/18/2017 01/02/2017 06/21/2016  WBC 4.0 - 10.5 K/uL 7.8 9.5 7.9  Hemoglobin 13.0 - 17.0 g/dL 14.0 13.8 -  Hematocrit 39.0 - 52.0 % 40.0 39.8 40.4  Platelets 150 - 400 K/uL 157 183.0 146(L)    @cmpl @  RADIOGRAPHIC STUDIES: I have personally reviewed the radiological images as listed and agreed with the findings in the report. US Paracentesis  Result Date: 01/18/2017 INDICATION: Cirrhosis, liver/left retroperitoneal masses,  recurrent ascites; request received for therapeutic paracentesis. EXAM: ULTRASOUND GUIDED THERAPEUTIC PARACENTESIS MEDICATIONS: None. COMPLICATIONS: None immediate. PROCEDURE: Informed written consent was obtained from the patient after a discussion of the risks, benefits and alternatives to treatment. A timeout was performed prior to the initiation of the procedure. Initial ultrasound scanning demonstrates a moderate to large amount of ascites within the left lower abdominal quadrant. The left lower abdomen was prepped and draped in the usual sterile fashion. 1% lidocaine was used for local anesthesia. Following this, a Yueh catheter was introduced. An ultrasound image was saved for documentation purposes. The paracentesis was performed. The catheter was removed and a dressing was applied. The patient tolerated the procedure well without immediate post procedural complication. FINDINGS: A total of approximately 4.6 liters of yellow fluid was removed. IMPRESSION: Successful ultrasound-guided therapeutic paracentesis yielding 4.6 liters liters of peritoneal fluid. Read by:  Nash Mantis Electronically Signed   By: Jerilynn Mages.  Shick M.D.   On: 01/18/2017 10:58   Ct Biopsy  Result Date: 01/18/2017 INDICATION: Ascites, peritoneal carcinomatosis, and retroperitoneal adenopathy by CT concerning for malignancy. EXAM: CT LEFT RETROPERITONEAL ADENOPATHY CORE BIOPSY MEDICATIONS: 1% lidocaine locally ANESTHESIA/SEDATION: None. Moderate Sedation Time: 0 minutes. The patient's level of consciousness and vital signs were monitored continuously by radiology nursing throughout the procedure under my direct supervision. FLUOROSCOPY TIME:  Fluoroscopy Time: None. COMPLICATIONS: None immediate. PROCEDURE: Informed written consent was obtained from the patient after a thorough discussion of the procedural risks, benefits and alternatives. All questions were addressed. Maximal Sterile Barrier Technique was utilized including caps, mask,  sterile gowns, sterile gloves, sterile drape, hand hygiene and skin antiseptic. A timeout was performed prior to the initiation of the procedure. Previous imaging reviewed. Patient positioned prone. Noncontrast localization CT performed. The left retroperitoneal adenopathy adjacent to the infrarenal aorta was localized. Under sterile conditions and local anesthesia, a 17 gauge 11.8 cm access needle was advanced from a left retroperitoneal approach to the left retroperitoneal para-aortic adenopathy. Needle position confirmed with CT. 4 18 gauge core biopsies obtained. Samples placed in saline. Needle removed. No immediate complication. Patient tolerated the biopsy well. IMPRESSION: Successful CT-guided left retroperitoneal adenopathy 18 gauge core biopsies Electronically Signed   By: Jerilynn Mages.  Shick M.D.   On: 01/18/2017 10:10   The following imaging studies were completed at [facility]:  CT Chest WO Contrast 12/25/16 IMPRESSION: 1.No acute abnormality in the chest. 2.Emphysema. 3.Ascites. Biliary ductal dilatation. Please see MRI of the abdomen performed today for further details of abdominal findings.  MRI Abdomen WO W Contrast 12/25/16 Impression: 1.Cirrhotic appearing  liver. There is a hypoenhancing mass centered at the common hepatic duct bifurcation resulting in moderate intrahepatic and extra hepatic biliary ductal dilation. 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 2.Anterior division of the right portal vein is not well seen and may be attenuated or occluded. 3.Large volume ascites.  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 There is moderate intrahepatic biliary ductal  dilation with an ill-defined mildly T2 hyperintense hypoenhancing 3.3 x 1.9 x 2.3 cm mass centered at  the common hepatic duct bifurcation resulting in moderate intrahepatic and extra hepatic biliary ductal dilation. 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 This was discussed in our GI tumor board. I reviewed the image with pt in person -His left para-aortic lymph node biopsy was negative. I discussed with patient and his wife. -Hepatocellular carcinoma is also a possibility, giving his underlying liver cirrhosis. However his AFP has been normal, and the image findings are more suspicious for cholangiocarcinoma. -I recommend fine-needle biopsy of his liver mass. I have discussed with interventional radiologist Dr. Pascal Lux this morning, he has reviewed the image with his colleagues, he feels it is a difficult to biopsy, but they can certainly try. I discussed with patient, he agrees. Dr. Pascal Lux plans to repeat a paracentesis first, followed by liver biopsy on same day in the next few weeks. -I briefly discussed the treatment options for localized clinical carcinoma, each includes surgery, liver targeted therapy (bland embolization or  radioembolization), liver radiation, and or systemic chemotherapy. Giving the central location of his tumor, with liver cirrhosis and ascites, he would be a poor candidate for surgery. But I will refer him to surgery if biopsy confirms cholangiocarcinoma.  -I will check CA19.9  2. 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 Loletha Carrow  - I discussed the importance of colonoscopy and upper endoscopy in the patient's care. At this time he is not interested in any procedures where he must go under general anesthesia. I encouraged the patient to consider this. - Labs and recent imaging reviewed with the patient and his wife.  -His diuretics has been decreased due to use borderline low blood pressure. -Patient  is not very compliant with low-salt diet.  3. HTN - The patient will continue to follow with his PCP for management. -His hypertension medication has been de-escalated lately due to his borderline hypotension.   PLAN: - Labs today including liver function and tumor marker CA19.9 - I will refer the patient to Dr. Pascal Lux in IR for liver biopsy. The will contact the patient to schedule an appointment. I will refer the patient for surgery as indicated following the biopsy. I will call him after his biopsy  - The patient will return to the clinic for follow up with me in 1 month. -I will copy his PCP Dr. Jeananne Rama and GI Dr. Loletha Carrow   All questions were answered. The patient knows to call the clinic with any problems, questions or concerns.  I spent 55 minutes counseling the patient face to face. The total time spent in the appointment was 70 minutes and more than 50% was on counseling.  This document serves as a record  of services personally performed by Truitt Merle, MD. It was created on her behalf by Maryla Morrow, a trained medical scribe. The creation of this record is based on the scribe's personal observations and the provider's statements to them. This document has been checked and approved by the attending provider.    Truitt Merle, MD 01/25/2017

## 2017-01-25 NOTE — Telephone Encounter (Signed)
Gave patient AVS and calender per 4/19 los. [facility] to contact patient with Ct and Parencentisis schedule.

## 2017-01-26 LAB — CANCER ANTIGEN 19-9: CAN 19-9: 232 U/mL — AB (ref 0–35)

## 2017-01-31 ENCOUNTER — Ambulatory Visit (HOSPITAL_COMMUNITY): Payer: Medicare Other

## 2017-02-07 ENCOUNTER — Other Ambulatory Visit: Payer: Self-pay | Admitting: General Surgery

## 2017-02-08 ENCOUNTER — Other Ambulatory Visit: Payer: Self-pay | Admitting: Hematology

## 2017-02-08 ENCOUNTER — Ambulatory Visit (HOSPITAL_COMMUNITY)
Admission: RE | Admit: 2017-02-08 | Discharge: 2017-02-08 | Disposition: A | Discharge status: Home / Self Care | Payer: Medicare Other | Source: Ambulatory Visit | Attending: Hematology | Admitting: Hematology

## 2017-02-08 ENCOUNTER — Encounter (HOSPITAL_COMMUNITY): Payer: Self-pay

## 2017-02-08 ENCOUNTER — Other Ambulatory Visit: Payer: Self-pay | Admitting: Student

## 2017-02-08 ENCOUNTER — Ambulatory Visit (INDEPENDENT_AMBULATORY_CARE_PROVIDER_SITE_OTHER): Payer: Medicare Other | Admitting: Gastroenterology

## 2017-02-08 DIAGNOSIS — K7030 Alcoholic cirrhosis of liver without ascites: Secondary | ICD-10-CM

## 2017-02-08 DIAGNOSIS — Z23 Encounter for immunization: Secondary | ICD-10-CM | POA: Diagnosis not present

## 2017-02-08 DIAGNOSIS — C221 Intrahepatic bile duct carcinoma: Secondary | ICD-10-CM

## 2017-02-08 DIAGNOSIS — C801 Malignant (primary) neoplasm, unspecified: Secondary | ICD-10-CM | POA: Diagnosis not present

## 2017-02-08 DIAGNOSIS — K7689 Other specified diseases of liver: Secondary | ICD-10-CM | POA: Diagnosis not present

## 2017-02-08 DIAGNOSIS — C787 Secondary malignant neoplasm of liver and intrahepatic bile duct: Secondary | ICD-10-CM | POA: Insufficient documentation

## 2017-02-08 DIAGNOSIS — R188 Other ascites: Secondary | ICD-10-CM | POA: Diagnosis not present

## 2017-02-08 LAB — APTT: aPTT: 36 seconds (ref 24–36)

## 2017-02-08 LAB — PROTIME-INR
INR: 1.16
PROTHROMBIN TIME: 14.8 s (ref 11.4–15.2)

## 2017-02-08 LAB — CBC
HEMATOCRIT: 41.5 % (ref 39.0–52.0)
Hemoglobin: 14.6 g/dL (ref 13.0–17.0)
MCH: 35.5 pg — AB (ref 26.0–34.0)
MCHC: 35.2 g/dL (ref 30.0–36.0)
MCV: 101 fL — AB (ref 78.0–100.0)
Platelets: 165 10*3/uL (ref 150–400)
RBC: 4.11 MIL/uL — AB (ref 4.22–5.81)
RDW: 14.2 % (ref 11.5–15.5)
WBC: 7.5 10*3/uL (ref 4.0–10.5)

## 2017-02-08 MED ORDER — LIDOCAINE-EPINEPHRINE (PF) 2 %-1:200000 IJ SOLN: 20.0000 mL | Freq: Once | INTRAMUSCULAR | Status: DC

## 2017-02-08 MED ORDER — MIDAZOLAM HCL 2 MG/2ML IJ SOLN
INTRAMUSCULAR | Status: AC
  Filled 2017-02-08: qty 6

## 2017-02-08 MED ORDER — SODIUM CHLORIDE 0.9 % IV SOLN
INTRAVENOUS | Status: DC
  Administered 2017-02-08: 10:00:00 via INTRAVENOUS

## 2017-02-08 MED ORDER — FENTANYL CITRATE (PF) 100 MCG/2ML IJ SOLN
INTRAMUSCULAR | Status: AC
  Filled 2017-02-08: qty 4

## 2017-02-08 NOTE — Discharge Instructions (Signed)
Liver Biopsy, Care After  These instructions give you information on caring for yourself after your procedure. Your doctor may also give you more specific instructions. Call your doctor if you have any problems or questions after your procedure.  Follow these instructions at home:  · Rest at home for 1-2 days or as told by your doctor.  · Have someone stay with you for at least 24 hours.  · Do not do these things in the first 24 hours:  ? Drive.  ? Use machinery.  ? Take care of other people.  ? Sign legal documents.  ? Take a bath or shower.  · There are many different ways to close and cover a cut (incision). For example, a cut can be closed with stitches, skin glue, or adhesive strips. Follow your doctor's instructions on:  ? Taking care of your cut.  ? Changing and removing your bandage (dressing).  ? Removing whatever was used to close your cut.  · Do not drink alcohol in the first week.  · Do not lift more than 5 pounds or play contact sports for the first 2 weeks.  · Take medicines only as told by your doctor. For 1 week, do not take medicine that has aspirin in it or medicines like ibuprofen.  · Get your test results.  Contact a doctor if:  · A cut bleeds and leaves more than just a small spot of blood.  · A cut is red, puffs up (swells), or hurts more than before.  · Fluid or something else comes from a cut.  · A cut smells bad.  · You have a fever or chills.  Get help right away if:  · You have swelling, bloating, or pain in your belly (abdomen).  · You get dizzy or faint.  · You have a rash.  · You feel sick to your stomach (nauseous) or throw up (vomit).  · You have trouble breathing, feel short of breath, or feel faint.  · Your chest hurts.  · You have problems talking or seeing.  · You have trouble balancing or moving your arms or legs.  This information is not intended to replace advice given to you by your health care provider. Make sure you discuss any questions you have with your health care  provider.  Document Released: 07/04/2008 Document Revised: 03/02/2016 Document Reviewed: 11/21/2013  Elsevier Interactive Patient Education © 2017 Elsevier Inc.

## 2017-02-08 NOTE — Procedures (Signed)
Pre procedural Dx: Indeterminate, infiltrative lesion within the central aspect of the right lobe of the liver  Post procedural Dx: Same  Technically successful CT guided paracentesis yielding 3.8 L of serous ascites fluid. Technically successful CT guided biopsy of  infiltrative lesion within the central aspect of the right lobe of the liver    EBL: None.   Complications: None immediate.   Ronny Bacon, MD Pager #: [phone]

## 2017-02-08 NOTE — Progress Notes (Signed)
Patient refused wheelchair transported to lobby.  RN advised patient hospital policy and patient safety with wheelchair transportation to lobby after procedure.  Patient states "I am not an invalent" and walked to elevator again refusing wheelchair.  Patient ambulatory to elevator with no issues noted.

## 2017-02-08 NOTE — Sedation Documentation (Signed)
Pt is declining sedation meds at this time, Watts MD is aware.  Pt reports he had same procedure x 2 weeks ago with Shick MD and did not have sedation.  Pt states he does not like to wait and is hungry.  Watts MD explained to the pt that today, the procedure might be more involved that the one he had 2 weeks ago and might need sedation.  Pt is still declining sedation but verbalized agreement to receive them when he needed it.

## 2017-02-08 NOTE — H&P (Signed)
Referring Physician(s): Feng,Yan  Supervising Physician: Sandi Mariscal  Patient Status:  [facility]  Chief Complaint:  "I'm having another biopsy"  Subjective: Patient familiar to IR service from prior paracenteses as well as a left retroperitoneal lymph node biopsy on 01/18/17 which was benign. 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 He presents again today for image guided biopsy of the hepatic mass/possible paracentesis. Patient currently denies fever, headache, chest pain, dyspnea, significant abdominal/back pain, nausea, vomiting or abnormal bleeding. He does bruise easily.  Past Medical History:  Diagnosis Date  . Allergy   . Cataract   . 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 Cirrhosis ([place])   . COPD (chronic obstructive pulmonary disease) ([place])   . Hyperglycemia   . Hypertension   . Substance abuse   . Ulcer    Past Surgical History:  Procedure Laterality Date  . TONSILLECTOMY    . VASECTOMY      Allergies: Accupril [quinapril hcl]  Medications: Prior to Admission medications   Medication Sig Start Date End Date Taking? Authorizing Provider  Ascorbic Acid (VITAMIN C) 1000 MG tablet Take 1,000 mg by mouth daily.   Yes Historical Provider, MD  B COMPLEX VITAMINS PO Take 1 capsule by mouth daily.   Yes Historical Provider, MD  BIOTIN PO Take 1 capsule by mouth daily.   Yes Historical Provider, MD  cetirizine (ZYRTEC) 10 MG tablet Take 10 mg by mouth daily.   Yes Historical Provider, MD  furosemide (LASIX) 40 MG tablet Take 40 mg by mouth daily.   Yes Historical Provider, MD  MULTIPLE VITAMIN PO Take 1 capsule by mouth daily.   Yes Historical Provider, MD  Omega-3 Fatty Acids (FISH OIL PO) Take 1 capsule by mouth daily.   Yes Historical Provider, MD  spironolactone (ALDACTONE) 100 MG tablet Take 100 mg by mouth daily.   Yes Historical  Provider, MD  Tiotropium Bromide Monohydrate (SPIRIVA RESPIMAT) 1.25 MCG/ACT AERS Inhale 1 puff into the lungs every morning. 02/15/16  Yes Guadalupe Maple, MD  VITAMIN E PO Take by mouth. 500mg  , one daily   Yes Historical Provider, MD  Zinc 100 MG TABS Take by mouth.   Yes Historical Provider, MD  carvedilol (COREG) 12.5 MG tablet Take 1 tablet (12.5 mg total) by mouth 2 (two) times daily with a meal. 06/21/16   Guadalupe Maple, MD     Vital Signs: BP 127/70 (BP Location: Left Arm)   Pulse (!) 56   Temp 97.7 F (36.5 C) (Oral)   Resp 18   SpO2 100%   Physical Exam awake, alert. Chest clear to auscultation bilaterally. Heart with slightly bradycardic but regular rhythm. Abdomen soft, slightly distended, few bowel sounds, nontender. No significant lower extremity edema  Imaging: No results found.  Labs:  CBC:  Recent Labs  06/21/16 1022 01/02/17 1051 01/18/17 0716  WBC 7.9 9.5 7.8  HGB  --  13.8 14.0  HCT 40.4 39.8 40.0  PLT 146* 183.0 157    COAGS:  Recent Labs  06/22/16 1109 01/02/17 1051 01/18/17 0716  INR 1.3* 1.2* 1.07  APTT  --   --  35    BMP:  Recent Labs  06/21/16 1022  08/28/16 0826 09/26/16 0921 11/14/16 0935 01/02/17 1051 01/25/17 1230  NA 135  < > 133* 132* 132* 133* 135*  K 3.9  < > 4.7 4.2 4.2 4.4  4.6  CL 93*  < > 95* 96 94* 100  --   CO2 27  < > 21 30 24 30 27   GLUCOSE 196*  < > 150* 147* 141* 103* 95  BUN 14  < > 31* 23 24 29* 30.1*  CALCIUM 9.4  < > 9.8 9.7 9.3 9.3 9.5  CREATININE 0.84  < > 0.85 0.91 0.85 0.96 1.0  GFRNONAA 87  --  87  --  86  --   --   GFRAA 101  --  101  --  100  --   --   < > = values in this interval not displayed.  LIVER FUNCTION TESTS:  Recent Labs  06/21/16 1022 11/14/16 0935 01/02/17 1051 01/25/17 1230  BILITOT 0.9  --  1.1 1.35*  AST 30  --  38* 48*  ALT 22  --  38 47  ALKPHOS 267* 409* 320* 447*  PROT 6.8  --  7.1 7.3  ALBUMIN 2.9*  --  2.8* 2.4*    Assessment and Plan: Pt with history of  alcoholic cirrhosis with ascites, normal AFP and an ill-defined hepatic mass at the common hepatic duct bifurcation concerning for cholangiocarcinoma. Recently underwent left retroperitoneal left node biopsy which was benign. He presents again today for image guided biopsy of the hepatic mass/possible paracentesis .Risks and benefits discussed with the patient/spouse including, but not limited to bleeding, infection, damage to adjacent structures or low yield requiring additional tests.All of the patient's questions were answered, patient is agreeable to proceed.Consent signed and in chart. Labs pending.      Electronically Signed: D. Rowe Robert 02/08/2017, 9:19 AM   I spent a total of 20 minutes  at the the patient's bedside AND on the patient's hospital floor or unit, greater than 50% of which was counseling/coordinating care for image guided liver lesion biopsy

## 2017-02-09 ENCOUNTER — Telehealth: Payer: Self-pay

## 2017-02-09 NOTE — Telephone Encounter (Signed)
Dr. Burr Medico, oncology, advised pt to call us about his BP running low. Pt currently taking Carvedilol 12.5 BID. Pt wondering if he should d/c medication. Please advise. Pt request VM be left @ [phone], if he does not answer.    BP Readings from Last 3 Encounters:  02/08/17 120/78  02/08/17 119/60  01/25/17 (!) 115/58

## 2017-02-12 NOTE — Telephone Encounter (Signed)
Call pt 

## 2017-02-12 NOTE — Telephone Encounter (Signed)
Phone call Discussed with patient with low blood pressure check 8. Carvedilol and observe blood pressure.

## 2017-02-14 ENCOUNTER — Telehealth: Payer: Self-pay | Admitting: Hematology

## 2017-02-14 ENCOUNTER — Other Ambulatory Visit: Payer: Self-pay | Admitting: Hematology

## 2017-02-14 DIAGNOSIS — C221 Intrahepatic bile duct carcinoma: Secondary | ICD-10-CM

## 2017-02-14 NOTE — Telephone Encounter (Signed)
I called pt and discussed his liver biopsy result, which showed adenocarcinoma, most consistent with cholangiocarcinoma. I will refer him to surgeon Dr. Cloyd Stagers at [facility], he agrees. If he is not a candidate for surgery, will refer him to IR Dr. Pascal Lux for liver embolization. He agrees.   Richard Whitehead  02/14/2017

## 2017-02-19 ENCOUNTER — Telehealth: Payer: Self-pay | Admitting: *Deleted

## 2017-02-19 NOTE — Telephone Encounter (Signed)
Pt called wanting to know about appt with Dr. Cloyd Stagers @ [facility].  Noted referral was made on 02/14/17 by Dr. Burr Medico.  Left message with Maudie Mercury in HIM for follow up on appt.  Pt inquired about phone number to [facility]; gave pt number as requested.  Pt stated he would try to call [facility] for the appt. Pt's   Phone     [phone].

## 2017-02-20 ENCOUNTER — Telehealth: Payer: Self-pay | Admitting: Hematology

## 2017-02-20 ENCOUNTER — Telehealth: Payer: Self-pay | Admitting: *Deleted

## 2017-02-20 NOTE — Telephone Encounter (Signed)
Pt called and left message re:  Pt has appt with [facility] at 1130 am on Friday  02/23/17.  This will be in conflict with appt here with Dr. Burr Medico.  Pt requested appt with Dr. Burr Medico to be cancelled. Pt's    Phone      [phone].

## 2017-02-20 NOTE — Telephone Encounter (Signed)
Faxed pt path reports to Dr. Cloyd Stagers.

## 2017-02-22 ENCOUNTER — Other Ambulatory Visit: Payer: Self-pay | Admitting: Gastroenterology

## 2017-02-22 NOTE — Telephone Encounter (Signed)
Patient last office visit 01/02/17 for cirrhosis. Requests refills for furosemide 40 mg daily. However, it seems that he has been on varying doses in the past and has not had these filled on a consistent basis at any point. Recent new dx metastatic adenocarcinoma. Please advise.

## 2017-02-23 ENCOUNTER — Other Ambulatory Visit: Payer: Medicare Other

## 2017-02-23 ENCOUNTER — Ambulatory Visit: Payer: Medicare Other | Admitting: Hematology

## 2017-02-23 DIAGNOSIS — R188 Other ascites: Secondary | ICD-10-CM | POA: Diagnosis not present

## 2017-02-23 DIAGNOSIS — F17210 Nicotine dependence, cigarettes, uncomplicated: Secondary | ICD-10-CM | POA: Diagnosis not present

## 2017-02-23 DIAGNOSIS — J449 Chronic obstructive pulmonary disease, unspecified: Secondary | ICD-10-CM | POA: Diagnosis not present

## 2017-02-23 DIAGNOSIS — I10 Essential (primary) hypertension: Secondary | ICD-10-CM | POA: Diagnosis not present

## 2017-02-23 DIAGNOSIS — K7460 Unspecified cirrhosis of liver: Secondary | ICD-10-CM | POA: Diagnosis not present

## 2017-02-23 DIAGNOSIS — C221 Intrahepatic bile duct carcinoma: Secondary | ICD-10-CM | POA: Diagnosis not present

## 2017-02-26 ENCOUNTER — Other Ambulatory Visit: Payer: Self-pay

## 2017-02-26 ENCOUNTER — Telehealth: Payer: Self-pay | Admitting: Gastroenterology

## 2017-02-26 MED ORDER — SPIRONOLACTONE 100 MG PO TABS: 100.0000 mg | ORAL_TABLET | Freq: Every day | ORAL | 2 refills | Status: AC

## 2017-02-26 NOTE — Telephone Encounter (Signed)
Yes, please refill his spironolactone 100 mg once daily.  Disp #30, RF 2

## 2017-02-26 NOTE — Telephone Encounter (Signed)
Refilled as directed by Dr Danis  

## 2017-02-26 NOTE — Telephone Encounter (Signed)
Refill request for Aldactone 100 mg a day. Last seen on 02-08-2017.

## 2017-03-07 ENCOUNTER — Other Ambulatory Visit: Payer: Self-pay

## 2017-03-07 ENCOUNTER — Telehealth: Payer: Self-pay | Admitting: Gastroenterology

## 2017-03-07 DIAGNOSIS — K7031 Alcoholic cirrhosis of liver with ascites: Secondary | ICD-10-CM

## 2017-03-07 NOTE — Telephone Encounter (Signed)
Patient advised of US paracentesis at [facility] 6/4 arrive at 9:45 for 10:00 appointment.

## 2017-03-07 NOTE — Telephone Encounter (Signed)
Yes, please arrange a 5-liter, ultrasound-guided paracentesis for next week. He does not need to be given IV albumin for it.

## 2017-03-07 NOTE — Telephone Encounter (Signed)
Routed to Dr. Danis. 

## 2017-03-08 DIAGNOSIS — K7469 Other cirrhosis of liver: Secondary | ICD-10-CM | POA: Diagnosis not present

## 2017-03-08 DIAGNOSIS — K766 Portal hypertension: Secondary | ICD-10-CM | POA: Diagnosis not present

## 2017-03-08 DIAGNOSIS — C221 Intrahepatic bile duct carcinoma: Secondary | ICD-10-CM | POA: Diagnosis not present

## 2017-03-10 DIAGNOSIS — T148XXA Other injury of unspecified body region, initial encounter: Secondary | ICD-10-CM | POA: Diagnosis not present

## 2017-03-10 DIAGNOSIS — L03114 Cellulitis of left upper limb: Secondary | ICD-10-CM | POA: Diagnosis not present

## 2017-03-12 ENCOUNTER — Ambulatory Visit (HOSPITAL_COMMUNITY)
Admission: RE | Admit: 2017-03-12 | Discharge: 2017-03-12 | Disposition: A | Discharge status: Home / Self Care | Payer: Medicare Other | Source: Ambulatory Visit | Attending: Gastroenterology | Admitting: Gastroenterology

## 2017-03-12 DIAGNOSIS — R188 Other ascites: Secondary | ICD-10-CM | POA: Diagnosis not present

## 2017-03-12 DIAGNOSIS — K7031 Alcoholic cirrhosis of liver with ascites: Secondary | ICD-10-CM | POA: Diagnosis not present

## 2017-03-12 NOTE — Procedures (Signed)
PROCEDURE SUMMARY:  Successful US guided therapeutic paracentesis from left lateral abdomen.  Yielded 5.0 liters of clear, yellow fluid.  No immediate complications.  Pt tolerated well.   Specimen was not sent for labs.  Docia Barrier PA-C 03/12/2017 2:27 PM

## 2017-03-14 ENCOUNTER — Other Ambulatory Visit: Payer: Self-pay

## 2017-03-14 ENCOUNTER — Other Ambulatory Visit: Payer: Self-pay | Admitting: Gastroenterology

## 2017-03-14 DIAGNOSIS — R188 Other ascites: Principal | ICD-10-CM

## 2017-03-14 DIAGNOSIS — K7460 Unspecified cirrhosis of liver: Secondary | ICD-10-CM

## 2017-03-14 DIAGNOSIS — L03114 Cellulitis of left upper limb: Secondary | ICD-10-CM | POA: Diagnosis not present

## 2017-03-14 DIAGNOSIS — T148XXD Other injury of unspecified body region, subsequent encounter: Secondary | ICD-10-CM | POA: Diagnosis not present

## 2017-03-16 DIAGNOSIS — C221 Intrahepatic bile duct carcinoma: Secondary | ICD-10-CM | POA: Diagnosis not present

## 2017-03-19 DIAGNOSIS — C221 Intrahepatic bile duct carcinoma: Secondary | ICD-10-CM | POA: Diagnosis not present

## 2017-03-19 DIAGNOSIS — R911 Solitary pulmonary nodule: Secondary | ICD-10-CM | POA: Diagnosis not present

## 2017-03-19 DIAGNOSIS — R188 Other ascites: Secondary | ICD-10-CM | POA: Diagnosis not present

## 2017-03-19 DIAGNOSIS — R938 Abnormal findings on diagnostic imaging of other specified body structures: Secondary | ICD-10-CM | POA: Diagnosis not present

## 2017-03-19 DIAGNOSIS — R590 Localized enlarged lymph nodes: Secondary | ICD-10-CM | POA: Diagnosis not present

## 2017-03-23 DIAGNOSIS — Z5111 Encounter for antineoplastic chemotherapy: Secondary | ICD-10-CM | POA: Diagnosis not present

## 2017-03-23 DIAGNOSIS — C221 Intrahepatic bile duct carcinoma: Secondary | ICD-10-CM | POA: Diagnosis not present

## 2017-03-27 ENCOUNTER — Telehealth: Payer: Self-pay | Admitting: Gastroenterology

## 2017-03-27 ENCOUNTER — Other Ambulatory Visit: Payer: Self-pay

## 2017-03-27 DIAGNOSIS — K7031 Alcoholic cirrhosis of liver with ascites: Secondary | ICD-10-CM

## 2017-03-27 NOTE — Telephone Encounter (Signed)
Please advise 

## 2017-03-27 NOTE — Telephone Encounter (Signed)
Patient advised to go to [facility] for paracentesis on 6/25 arrive 9:45 for 10:00. Patient understands to not take his spironolactone and furosemide day of paracentesis.

## 2017-03-27 NOTE — Telephone Encounter (Signed)
Patient wants to get another paracentesis. He states he has abdominal itching, advised to get OTC topical gold bond cream for itching. Please advise on paracentesis, patient would like it next Monday if possible.

## 2017-03-27 NOTE — Telephone Encounter (Signed)
Best bet is to use moist, flushable toilet wipes in stead of toilet paper for several days, and to apply gold bond powder after cleaning.  If still bothering him after that, use OTC preparation H cream with hydrocortisone.

## 2017-03-27 NOTE — Telephone Encounter (Signed)
Please schedule for a 5 liter therapeutic paracentesis next week, Monday if radiology can accommodate that. He does not need IV albumin for it.  Do not take spironolactone or furosemide the day of paracentesis.

## 2017-03-28 ENCOUNTER — Telehealth: Payer: Self-pay | Admitting: Gastroenterology

## 2017-03-28 NOTE — Telephone Encounter (Signed)
That is fine.  It is why I previously suggested that we just set this up as a standing appointment every 2 weeks.  He did not want to do that before, but perhaps he will be agreeable to that now.

## 2017-03-28 NOTE — Telephone Encounter (Signed)
Patient states he cannot wait to have paracentesis on Monday 6/25. He wanted to schedule sooner. I have rescheduled him to [facility] for 6/21 at 10:00. Patient understands to not take his spironolactone and furosemide tomorrow.

## 2017-03-29 ENCOUNTER — Ambulatory Visit (HOSPITAL_COMMUNITY)
Admission: RE | Admit: 2017-03-29 | Discharge: 2017-03-29 | Disposition: A | Discharge status: Home / Self Care | Payer: Medicare Other | Source: Ambulatory Visit | Attending: Gastroenterology | Admitting: Gastroenterology

## 2017-03-29 ENCOUNTER — Other Ambulatory Visit: Payer: Self-pay | Admitting: Hematology

## 2017-03-29 ENCOUNTER — Telehealth: Payer: Self-pay | Admitting: Gastroenterology

## 2017-03-29 ENCOUNTER — Telehealth: Payer: Self-pay | Admitting: Family Medicine

## 2017-03-29 DIAGNOSIS — K7031 Alcoholic cirrhosis of liver with ascites: Secondary | ICD-10-CM | POA: Diagnosis not present

## 2017-03-29 DIAGNOSIS — R188 Other ascites: Secondary | ICD-10-CM | POA: Diagnosis not present

## 2017-03-29 LAB — BODY FLUID CELL COUNT WITH DIFFERENTIAL
Lymphs, Fluid: 55 %
Monocyte-Macrophage-Serous Fluid: 9 % — ABNORMAL LOW (ref 50–90)
NEUTROPHIL FLUID: 36 % — AB (ref 0–25)
WBC FLUID: 202 uL (ref 0–1000)

## 2017-03-29 NOTE — Telephone Encounter (Signed)
Patients wife called to schedule patient an appointment for issues with his groin area such as pain. Patient would like to have an appointment or speak with his pcp regarding what he should do regarding his issue. Patient's wife stated patient would only like to speak with his pcp or male regarding the matter. Patient and wife are aware that provider is out of the office.   Please Advise.  Thank you

## 2017-03-29 NOTE — Procedures (Signed)
Ultrasound-guided diagnostic and therapeutic paracentesis performed yielding 5 liters (maximum ordered) of hazy, yellow fluid. No immediate complications.A portion of the fluid was submitted to the lab for preordered studies.

## 2017-03-30 ENCOUNTER — Other Ambulatory Visit: Payer: Self-pay

## 2017-03-30 DIAGNOSIS — K7031 Alcoholic cirrhosis of liver with ascites: Secondary | ICD-10-CM

## 2017-03-30 DIAGNOSIS — Z5111 Encounter for antineoplastic chemotherapy: Secondary | ICD-10-CM | POA: Diagnosis not present

## 2017-03-30 DIAGNOSIS — C221 Intrahepatic bile duct carcinoma: Secondary | ICD-10-CM | POA: Diagnosis not present

## 2017-03-30 NOTE — Telephone Encounter (Signed)
Every three weeks per his request. 5 liters, no albumin No spironolactone or furosemide the day of procedure

## 2017-03-30 NOTE — Telephone Encounter (Signed)
Patient's wife returned call. Call transferred to [place].

## 2017-03-30 NOTE — Telephone Encounter (Signed)
Left message on machine for pt to return call to the office.  

## 2017-03-30 NOTE — Telephone Encounter (Signed)
Put pt on schedule for next Thursday with Dr. Jeananne Rama. Will call patient to notify after 9 a.m. Is this okay to wait til Thursday. Please advise.

## 2017-03-30 NOTE — Telephone Encounter (Signed)
OK to wait unless pain becomes severe or pt starts having fevers/chills, etc. He will need to seek care sooner if so

## 2017-03-30 NOTE — Telephone Encounter (Signed)
Relayed info to wife. She will call oncology as well.

## 2017-03-30 NOTE — Telephone Encounter (Signed)
Do you want me to schedule these every 3 weeks or 2, patient had requested in 3 weeks. And if so, just a 5 liter limit, no albumin, holding spironolactone and furosemide day of paracentesis? Thank you.

## 2017-04-01 NOTE — Telephone Encounter (Signed)
Call pt 

## 2017-04-02 ENCOUNTER — Other Ambulatory Visit: Payer: Self-pay

## 2017-04-02 ENCOUNTER — Telehealth: Payer: Self-pay | Admitting: Family Medicine

## 2017-04-02 ENCOUNTER — Ambulatory Visit (HOSPITAL_COMMUNITY): Payer: Medicare Other

## 2017-04-02 DIAGNOSIS — K7031 Alcoholic cirrhosis of liver with ascites: Secondary | ICD-10-CM

## 2017-04-02 NOTE — Telephone Encounter (Signed)
Left message on machine for pt to return call to the office.  

## 2017-04-02 NOTE — Telephone Encounter (Signed)
Per New telephone encounter,   "Patient's wife is calling due to patient having trouble sleeping. Would like to see if there is something patient could take to help him sleep.   Please Advise.  Thank you"

## 2017-04-02 NOTE — Telephone Encounter (Signed)
Scheduled next 3 US paracentesis at [facility], had to leave vm on patient's phone with dates/time. 7/12 at 10:00, 8/2 at 10:00 and 8/23 at 10:00. Patient to arrive 15 minutes early and may call scheduling to reschedule. Instructed as always, to hold his spironolactone and furosemide day of procedure. Mailed copy of this to patient.

## 2017-04-02 NOTE — Telephone Encounter (Signed)
Patient's wife is calling due to patient having trouble sleeping. Would like to see if there is something patient could take to help him sleep.   Please Advise.  Thank you

## 2017-04-02 NOTE — Telephone Encounter (Signed)
Telephone encounter already open, please see previous telephone encounter.

## 2017-04-03 ENCOUNTER — Telehealth: Payer: Self-pay | Admitting: Family Medicine

## 2017-04-03 ENCOUNTER — Ambulatory Visit: Payer: Self-pay | Admitting: Family Medicine

## 2017-04-03 DIAGNOSIS — I10 Essential (primary) hypertension: Secondary | ICD-10-CM | POA: Diagnosis present

## 2017-04-03 DIAGNOSIS — K831 Obstruction of bile duct: Secondary | ICD-10-CM | POA: Diagnosis not present

## 2017-04-03 DIAGNOSIS — Z66 Do not resuscitate: Secondary | ICD-10-CM | POA: Diagnosis present

## 2017-04-03 DIAGNOSIS — K7030 Alcoholic cirrhosis of liver without ascites: Secondary | ICD-10-CM | POA: Diagnosis present

## 2017-04-03 DIAGNOSIS — E871 Hypo-osmolality and hyponatremia: Secondary | ICD-10-CM | POA: Diagnosis present

## 2017-04-03 DIAGNOSIS — K766 Portal hypertension: Secondary | ICD-10-CM | POA: Diagnosis present

## 2017-04-03 DIAGNOSIS — R188 Other ascites: Secondary | ICD-10-CM | POA: Diagnosis not present

## 2017-04-03 DIAGNOSIS — K8020 Calculus of gallbladder without cholecystitis without obstruction: Secondary | ICD-10-CM | POA: Diagnosis not present

## 2017-04-03 DIAGNOSIS — F17210 Nicotine dependence, cigarettes, uncomplicated: Secondary | ICD-10-CM | POA: Diagnosis present

## 2017-04-03 DIAGNOSIS — J449 Chronic obstructive pulmonary disease, unspecified: Secondary | ICD-10-CM | POA: Diagnosis present

## 2017-04-03 DIAGNOSIS — C221 Intrahepatic bile duct carcinoma: Secondary | ICD-10-CM | POA: Diagnosis not present

## 2017-04-03 DIAGNOSIS — R590 Localized enlarged lymph nodes: Secondary | ICD-10-CM | POA: Diagnosis not present

## 2017-04-03 NOTE — Telephone Encounter (Signed)
Pt has appt with provider today. 

## 2017-04-03 NOTE — Telephone Encounter (Signed)
Patient is hospitalized cancelled appt will call back to reschedule.  Just FYI

## 2017-04-04 ENCOUNTER — Ambulatory Visit (HOSPITAL_COMMUNITY): Payer: Medicare Other

## 2017-04-05 ENCOUNTER — Ambulatory Visit: Payer: Self-pay | Admitting: Family Medicine

## 2017-04-05 ENCOUNTER — Other Ambulatory Visit: Payer: Self-pay

## 2017-04-05 ENCOUNTER — Telehealth: Payer: Self-pay | Admitting: Gastroenterology

## 2017-04-05 DIAGNOSIS — C221 Intrahepatic bile duct carcinoma: Secondary | ICD-10-CM | POA: Diagnosis not present

## 2017-04-05 DIAGNOSIS — K7031 Alcoholic cirrhosis of liver with ascites: Secondary | ICD-10-CM

## 2017-04-05 NOTE — Telephone Encounter (Signed)
Called patient back, had to lvm that he is scheduled for paracentesis at [facility] for Friday 6/29 arrive at 8:45 for a 9:00. Instructed to hold diuretics day of.

## 2017-04-05 NOTE — Telephone Encounter (Signed)
You can coordinate a paracentesis to be done as soon as possible (today or tomorrow?). If cannot be done as outpatient in a time frame that is acceptable to him then he will need to go to the ER for further care if he feels that poorly.

## 2017-04-05 NOTE — Telephone Encounter (Signed)
Patient's wife called back, gave her the information re: US paracentesis scheduled for tomorrow at [facility].

## 2017-04-05 NOTE — Telephone Encounter (Signed)
Left message for patient that I had called on 6/25 and mailed information that I have arranged 3 paracentesis, with phone number to call if these dates/times do not work.

## 2017-04-05 NOTE — Telephone Encounter (Signed)
Routed to DOD, Dr. Loletha Carrow patient, cirrhosis patient with ascites, takes spironolactone 100 mg and lasix 40 mg daily. Patient on 6/26 had ERCP with stent placement done at [facility]. They had instructed him not to take diuretics, he took them last night as he has fluid accumulation in abdomen, making it "difficult to breath" and had so much edema in his legs that fluid "pouring out of my legs", although patient is in his car driving at the moment. He had been getting US paracentesis at [facility] every 2-3 weeks. Last one done on 03/29/17, next is scheduled for 04/19/17. Patient is requesting fluid to be removed today. Please advise.

## 2017-04-06 ENCOUNTER — Encounter (HOSPITAL_COMMUNITY): Payer: Self-pay | Admitting: Student

## 2017-04-06 ENCOUNTER — Ambulatory Visit (HOSPITAL_COMMUNITY)
Admission: RE | Admit: 2017-04-06 | Discharge: 2017-04-06 | Disposition: A | Discharge status: Home / Self Care | Payer: Medicare Other | Source: Ambulatory Visit | Attending: Gastroenterology | Admitting: Gastroenterology

## 2017-04-06 ENCOUNTER — Other Ambulatory Visit: Payer: Self-pay | Admitting: Gastroenterology

## 2017-04-06 DIAGNOSIS — K7031 Alcoholic cirrhosis of liver with ascites: Secondary | ICD-10-CM

## 2017-04-06 DIAGNOSIS — R188 Other ascites: Secondary | ICD-10-CM | POA: Insufficient documentation

## 2017-04-06 HISTORY — PX: IR PARACENTESIS: IMG2679

## 2017-04-06 LAB — BODY FLUID CELL COUNT WITH DIFFERENTIAL
Lymphs, Fluid: 46 %
Monocyte-Macrophage-Serous Fluid: 22 % — ABNORMAL LOW (ref 50–90)
Neutrophil Count, Fluid: 32 % — ABNORMAL HIGH (ref 0–25)
Total Nucleated Cell Count, Fluid: 109 cu mm (ref 0–1000)

## 2017-04-06 MED ORDER — LIDOCAINE HCL 1 % IJ SOLN
INTRAMUSCULAR | Status: DC | PRN
  Administered 2017-04-06: 10 mL

## 2017-04-06 MED ORDER — LIDOCAINE HCL (PF) 1 % IJ SOLN
INTRAMUSCULAR | Status: AC
  Filled 2017-04-06: qty 30

## 2017-04-06 NOTE — Procedures (Signed)
PROCEDURE SUMMARY:  Successful US guided paracentesis from right lateral abdomen.  Yielded 5.0 liters of hazy, yellow fluid.  No immediate complications.  Pt tolerated well.   Specimen was sent for labs.  Docia Barrier PA-C 04/06/2017 11:47 AM

## 2017-04-09 ENCOUNTER — Encounter (HOSPITAL_COMMUNITY): Payer: Self-pay | Admitting: Emergency Medicine

## 2017-04-09 ENCOUNTER — Emergency Department (HOSPITAL_COMMUNITY)
Admission: EM | Admit: 2017-04-09 | Discharge: 2017-04-09 | Disposition: A | Discharge status: Home / Self Care | Payer: Medicare Other | Attending: Emergency Medicine | Admitting: Emergency Medicine

## 2017-04-09 ENCOUNTER — Telehealth: Payer: Self-pay | Admitting: Gastroenterology

## 2017-04-09 ENCOUNTER — Ambulatory Visit
Admission: EM | Admit: 2017-04-09 | Discharge: 2017-04-09 | Disposition: A | Discharge status: Home / Self Care | Payer: Medicare Other | Attending: Family Medicine | Admitting: Family Medicine

## 2017-04-09 DIAGNOSIS — M79604 Pain in right leg: Secondary | ICD-10-CM

## 2017-04-09 DIAGNOSIS — J449 Chronic obstructive pulmonary disease, unspecified: Secondary | ICD-10-CM | POA: Insufficient documentation

## 2017-04-09 DIAGNOSIS — L03115 Cellulitis of right lower limb: Secondary | ICD-10-CM

## 2017-04-09 DIAGNOSIS — R000 Tachycardia, unspecified: Secondary | ICD-10-CM

## 2017-04-09 DIAGNOSIS — I10 Essential (primary) hypertension: Secondary | ICD-10-CM | POA: Insufficient documentation

## 2017-04-09 DIAGNOSIS — I872 Venous insufficiency (chronic) (peripheral): Secondary | ICD-10-CM | POA: Diagnosis not present

## 2017-04-09 DIAGNOSIS — Z79899 Other long term (current) drug therapy: Secondary | ICD-10-CM | POA: Insufficient documentation

## 2017-04-09 DIAGNOSIS — F17210 Nicotine dependence, cigarettes, uncomplicated: Secondary | ICD-10-CM | POA: Diagnosis not present

## 2017-04-09 DIAGNOSIS — R188 Other ascites: Secondary | ICD-10-CM

## 2017-04-09 LAB — CBC WITH DIFFERENTIAL/PLATELET
BASOS ABS: 0.1 10*3/uL (ref 0.0–0.1)
BASOS PCT: 1 %
Eosinophils Absolute: 0 10*3/uL (ref 0.0–0.7)
Eosinophils Relative: 0 %
HCT: 34.7 % — ABNORMAL LOW (ref 39.0–52.0)
HEMOGLOBIN: 12.2 g/dL — AB (ref 13.0–17.0)
LYMPHS PCT: 6 %
Lymphs Abs: 0.7 10*3/uL (ref 0.7–4.0)
MCH: 34.6 pg — ABNORMAL HIGH (ref 26.0–34.0)
MCHC: 35.2 g/dL (ref 30.0–36.0)
MCV: 98.3 fL (ref 78.0–100.0)
MONOS PCT: 7 %
Monocytes Absolute: 0.8 10*3/uL (ref 0.1–1.0)
NEUTROS ABS: 9.3 10*3/uL — AB (ref 1.7–7.7)
NEUTROS PCT: 86 %
Platelets: 106 10*3/uL — ABNORMAL LOW (ref 150–400)
RBC: 3.53 MIL/uL — ABNORMAL LOW (ref 4.22–5.81)
RDW: 17.2 % — ABNORMAL HIGH (ref 11.5–15.5)
WBC: 10.9 10*3/uL — ABNORMAL HIGH (ref 4.0–10.5)

## 2017-04-09 LAB — COMPREHENSIVE METABOLIC PANEL
ALK PHOS: 294 U/L — AB (ref 38–126)
ALT: 110 U/L — AB (ref 17–63)
AST: 122 U/L — AB (ref 15–41)
Albumin: 1.8 g/dL — ABNORMAL LOW (ref 3.5–5.0)
Anion gap: 9 (ref 5–15)
BUN: 38 mg/dL — AB (ref 6–20)
CALCIUM: 8.9 mg/dL (ref 8.9–10.3)
CHLORIDE: 94 mmol/L — AB (ref 101–111)
CO2: 25 mmol/L (ref 22–32)
CREATININE: 1.41 mg/dL — AB (ref 0.61–1.24)
GFR calc non Af Amer: 48 mL/min — ABNORMAL LOW (ref >60)
GFR, EST AFRICAN AMERICAN: 56 mL/min — AB (ref >60)
GLUCOSE: 128 mg/dL — AB (ref 65–99)
Potassium: 4.3 mmol/L (ref 3.5–5.1)
SODIUM: 128 mmol/L — AB (ref 135–145)
Total Bilirubin: 3.1 mg/dL — ABNORMAL HIGH (ref 0.3–1.2)
Total Protein: 6.5 g/dL (ref 6.5–8.1)

## 2017-04-09 LAB — PATHOLOGIST SMEAR REVIEW

## 2017-04-09 LAB — I-STAT CG4 LACTIC ACID, ED: Lactic Acid, Venous: 1.87 mmol/L (ref 0.5–1.9)

## 2017-04-09 MED ORDER — AMPICILLIN-SULBACTAM SODIUM 3 (2-1) G IJ SOLR
3.0000 g | Freq: Once | INTRAMUSCULAR | Status: AC
  Administered 2017-04-09: 3 g via INTRAVENOUS
  Filled 2017-04-09: qty 3

## 2017-04-09 MED ORDER — AMOXICILLIN-POT CLAVULANATE 500-125 MG PO TABS: 1.0000 | ORAL_TABLET | Freq: Three times a day (TID) | ORAL | 0 refills | Status: DC

## 2017-04-09 NOTE — Discharge Instructions (Signed)
Augmentin as prescribed.  Wound care with bacitracin, nonadherent dressing, and Ace bandage twice daily.  Follow-up with the wound care clinic. The contact information for the office has been provided in this discharge summary for you to call and make these arrangements.  Return to the emergency department in the meantime if symptoms significantly worsen or change.

## 2017-04-09 NOTE — ED Provider Notes (Signed)
[place] DEPT Provider Note   CSN: [id number] Arrival date & time: 04/09/17  1430     History   Chief Complaint Chief Complaint  Patient presents with  . Leg Pain    HPI Richard Whitehead is a 71 y.o. male.  Patient is a 71 year old male with extensive past medical history including cirrhosis of the liver, cholangiocarcinoma, recurrent ascites, and COPD. He presents today for evaluation of right leg pain and swelling. This has worsened over the past several days. He believes he may have scratched himself while removing his support stockings. He denies any fevers or chills.  He went to urgent care this afternoon, then was referred here for intravenous antibiotics and further wound care.   The history is provided by the patient.  Leg Pain   This is a new problem. Episode onset: One week ago. The problem occurs constantly. The problem has been gradually worsening. The pain is present in the right lower leg. The pain is moderate. Pertinent negatives include no numbness. He has tried nothing for the symptoms. The treatment provided no relief.    Past Medical History:  Diagnosis Date  . Allergy   . Cataract   . 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 Cirrhosis ([place])   . COPD (chronic obstructive pulmonary disease) ([place])   . Hyperglycemia   . Hypertension   . Substance abuse   . Ulcer     Patient Active Problem List   Diagnosis Date Noted  . 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 Liver mass 12/15/2016  . Atherosclerosis of aorta ([place]) 12/15/2016  . Lung mass 12/15/2016  . Ascites 06/22/2016  . ETOH abuse 06/22/2016  . Hepatomegaly 06/21/2016  . Elevated alkaline phosphatase level 06/02/2015  . COPD (chronic obstructive pulmonary disease) ([place])   . Hypertension     Past Surgical History:  Procedure Laterality Date  . IR PARACENTESIS  04/06/2017  . TONSILLECTOMY    . VASECTOMY         Home Medications    Prior to Admission medications   Medication Sig Start  Date End Date Taking? Authorizing Provider  Ascorbic Acid (VITAMIN C) 1000 MG tablet Take 1,000 mg by mouth daily.    [provider]  B COMPLEX VITAMINS PO Take 1 capsule by mouth daily.    [provider]  BIOTIN PO Take 1 capsule by mouth daily.    [provider]  carvedilol (COREG) 12.5 MG tablet Take 1 tablet (12.5 mg total) by mouth 2 (two) times daily with a meal. 06/21/16   Crissman, Jeannette How, MD  cetirizine (ZYRTEC) 10 MG tablet Take 10 mg by mouth daily.    [provider]  furosemide (LASIX) 40 MG tablet Take 40 mg by mouth daily.    [provider]  furosemide (LASIX) 40 MG tablet TAKE 1 TABLET BY MOUTH ONCE DAILY 02/23/17   Nelida Meuse III, MD  MULTIPLE VITAMIN PO Take 1 capsule by mouth daily.    [provider]  Omega-3 Fatty Acids (FISH OIL PO) Take 1 capsule by mouth daily.    [provider]  spironolactone (ALDACTONE) 100 MG tablet Take 1 tablet (100 mg total) by mouth daily. 02/26/17   Doran Stabler, MD  Tiotropium Bromide Monohydrate (SPIRIVA RESPIMAT) 1.25 MCG/ACT AERS Inhale 1 puff into the lungs every morning. 02/15/16   Guadalupe Maple, MD  VITAMIN E PO Take by mouth. 500mg  , one daily    [provider]  Zinc 100 MG  TABS Take by mouth.    [provider]    Family History Family History  Problem Relation Age of Onset  . Pancreatic cancer Mother        spread to liver; died age 62  . Cancer Mother 78       liver cancer   . Heart attack Paternal Grandfather   . Colon cancer Neg Hx     Social History Social History  Substance Use Topics  . Smoking status: Current Every Day Smoker    Packs/day: 1.00    Years: 50.00    Types: Cigarettes  . Smokeless tobacco: Never Used     Comment: form given 06-22-16  . Alcohol use No     Comment: quit sept 2017     Allergies   Accupril [quinapril hcl]   Review of Systems Review of Systems  Neurological: Negative for numbness.  All  other systems reviewed and are negative.    Physical Exam Updated Vital Signs BP 138/79 (BP Location: Left Arm)   Pulse (!) 113   Temp 97.5 F (36.4 C) (Oral)   Resp 20   Ht 5' 8.5" (1.74 m)   Wt 73.5 kg (162 lb)   SpO2 97%   BMI 24.27 kg/m   Physical Exam  Constitutional: He is oriented to person, place, and time. No distress.  Patient is a thin, 71 year old male who appears chronically ill.  HENT:  Head: Normocephalic and atraumatic.  Mouth/Throat: Oropharynx is clear and moist.  Neck: Normal range of motion. Neck supple.  Cardiovascular: Normal rate and regular rhythm.  Exam reveals no friction rub.   No murmur heard. Pulmonary/Chest: Effort normal and breath sounds normal. No respiratory distress. He has no wheezes. He has no rales.  Abdominal: Soft. Bowel sounds are normal. He exhibits no distension. There is no tenderness.  Musculoskeletal: Normal range of motion. He exhibits no edema.  There are 2 areas of skin breakdown to the right lower leg. One is to the anterior shin, the other to the lateral aspect of the calf.  Neurological: He is alert and oriented to person, place, and time. Coordination normal.  Skin: Skin is warm and dry. He is not diaphoretic.  Nursing note and vitals reviewed.    ED Treatments / Results  Labs (all labs ordered are listed, but only abnormal results are displayed) Labs Reviewed  CBC WITH DIFFERENTIAL/PLATELET - Abnormal; Notable for the following:       Result Value   WBC 10.9 (*)    RBC 3.53 (*)    Hemoglobin 12.2 (*)    HCT 34.7 (*)    MCH 34.6 (*)    RDW 17.2 (*)    Platelets 106 (*)    Neutro Abs 9.3 (*)    All other components within normal limits  COMPREHENSIVE METABOLIC PANEL - Abnormal; Notable for the following:    Sodium 128 (*)    Chloride 94 (*)    Glucose, Bld 128 (*)    BUN 38 (*)    Creatinine, Ser 1.41 (*)    Albumin 1.8 (*)    AST 122 (*)    ALT 110 (*)    Alkaline Phosphatase 294 (*)    Total Bilirubin  3.1 (*)    GFR calc non Af Amer 48 (*)    GFR calc Af Amer 56 (*)    All other components within normal limits  I-STAT CG4 LACTIC ACID, ED    EKG  EKG Interpretation None  Radiology No results found.  Procedures Procedures (including critical care time)  Medications Ordered in ED Medications  Ampicillin-Sulbactam (UNASYN) 3 g in sodium chloride 0.9 % 100 mL IVPB (not administered)     Initial Impression / Assessment and Plan / ED Course  I have reviewed the triage vital signs and the nursing notes.  Pertinent labs & imaging results that were available during my care of the patient were reviewed by me and considered in my medical decision making (see chart for details).  Patient with 2 leg sores and surrounding cellulitis. He was given IV Unasyn and will be discharged on Augmentin. He is to perform local wound care at home. He was also given contact information for the wound clinic with whom he can follow up.  Final Clinical Impressions(s) / ED Diagnoses   Final diagnoses:  None    New Prescriptions New Prescriptions   No medications on file     Veryl Speak, MD 04/11/17 [phone]

## 2017-04-09 NOTE — Discharge Instructions (Signed)
Go directly to Emergency room as discussed.  °

## 2017-04-09 NOTE — Telephone Encounter (Signed)
Spoke to patient's wife, she states he is still taking chemo, very fatigued, legs still having a lot of edema. I have instructed her to elevate his legs throughout the day, not just when napping or at night. He feels that he needs to have more frequent paracentesis, now every 7 days, last done 04/06/17. I let her know that Dr. Loletha Carrow would not be in the office until 7/5 and he can address it then. Patient also has a call into oncologist. Called patient's wife back made sure that she understands to take him to ED if he worsens or does not hear from onocologist. They did hear from oncologist office at [facility], they want him to go to ED, which is where she is taking him now.

## 2017-04-09 NOTE — ED Triage Notes (Signed)
Pt sent to ED from Urgent Care ref. Cellulitis of right lower leg.  Also rapid heart rate,.  Dressing on right lower leg not removed in triage

## 2017-04-09 NOTE — ED Triage Notes (Signed)
71 year old Caucasian male presents today with his wife with complaints of right lower leg pain that started on Friday. He states he when he pulled his socks off skin came off and the area looks like it is getting infected.

## 2017-04-09 NOTE — ED Provider Notes (Signed)
[facility] ____________________________________________  Time seen: Approximately 1:10 PM  I have reviewed the triage vital signs and the nursing notes.   HISTORY  Chief Complaint Leg Pain   HPI Richard Whitehead is a 71 y.o. male the past medical history of cholangiocarcinoma, cirrhosis, ascites, COPD, and hypertension presenting with wife at bedside for evaluation of right leg pain, swelling and skin changes. Wife reports that when patient was taken off compression socks this past week the skin pulled off causing your wound. Reports has had increased size of the wound as well as redness over the last few days. Patient does report mild pain surrounding the wound, denies other pain to right leg. Reports generalized weakness and fatigue, patient states that this has contributed to current chemotherapy. Also reports he has some generalized shortness of breath, but further states that he does not have shortness of breath when questioned. Denies any associated chest pain or fevers. Wife reports that he was recently in the hospital last week and he had a biliary stent placed and at that time he also received IV antibiotics. Denies any other recent antibiotic use. States mild pain to right leg at this time. Denies taking over-the-counter medications for the same complaint. Wife reports that she's been applying creams and wrapping at home. Denies aggravating or alleviating factors. Reports that they called his doctor was directed to go and receive IV antibiotics. Has not been seen for this complaint previously.Denies paresthesias or pain radiation.   Guadalupe Maple, MD: PCP   Past Medical History:  Diagnosis Date  . Allergy   . Cataract   . Cholangiocarcinoma ([place]) 01/25/2017  . Cirrhosis ([place])   . COPD (chronic obstructive pulmonary disease) ([place])   . Hyperglycemia   . Hypertension   . Substance abuse   . Ulcer     Patient Active Problem List   Diagnosis Date Noted  .  Cholangiocarcinoma ([place]) 01/25/2017  . Liver mass 12/15/2016  . Atherosclerosis of aorta ([place]) 12/15/2016  . Lung mass 12/15/2016  . Ascites 06/22/2016  . ETOH abuse 06/22/2016  . Hepatomegaly 06/21/2016  . Elevated alkaline phosphatase level 06/02/2015  . COPD (chronic obstructive pulmonary disease) ([place])   . Hypertension     Past Surgical History:  Procedure Laterality Date  . IR PARACENTESIS  04/06/2017  . TONSILLECTOMY    . VASECTOMY       No current facility-administered medications for this encounter.   Current Outpatient Prescriptions:  .  Ascorbic Acid (VITAMIN C) 1000 MG tablet, Take 1,000 mg by mouth daily., Disp: , Rfl:  .  B COMPLEX VITAMINS PO, Take 1 capsule by mouth daily., Disp: , Rfl:  .  BIOTIN PO, Take 1 capsule by mouth daily., Disp: , Rfl:  .  cetirizine (ZYRTEC) 10 MG tablet, Take 10 mg by mouth daily., Disp: , Rfl:  .  furosemide (LASIX) 40 MG tablet, Take 40 mg by mouth daily., Disp: , Rfl:  .  furosemide (LASIX) 40 MG tablet, TAKE 1 TABLET BY MOUTH ONCE DAILY, Disp: 90 tablet, Rfl: 1 .  MULTIPLE VITAMIN PO, Take 1 capsule by mouth daily., Disp: , Rfl:  .  Omega-3 Fatty Acids (FISH OIL PO), Take 1 capsule by mouth daily., Disp: , Rfl:  .  spironolactone (ALDACTONE) 100 MG tablet, Take 1 tablet (100 mg total) by mouth daily., Disp: 30 tablet, Rfl: 2 .  Tiotropium Bromide Monohydrate (SPIRIVA RESPIMAT) 1.25 MCG/ACT AERS, Inhale 1 puff into the lungs every morning., Disp: 1 Inhaler,  Rfl: 12 .  VITAMIN E PO, Take by mouth. 500mg  , one daily, Disp: , Rfl:  .  Zinc 100 MG TABS, Take by mouth., Disp: , Rfl:  .  carvedilol (COREG) 12.5 MG tablet, Take 1 tablet (12.5 mg total) by mouth 2 (two) times daily with a meal., Disp: 60 tablet, Rfl: 12  Allergies Accupril [quinapril hcl]  Family History  Problem Relation Age of Onset  . Pancreatic cancer Mother        spread to liver; died age 54  . Cancer Mother 66       liver cancer   . Heart attack Paternal  Grandfather   . Colon cancer Neg Hx     Social History Social History  Substance Use Topics  . Smoking status: Current Every Day Smoker    Packs/day: 1.00    Years: 50.00    Types: Cigarettes  . Smokeless tobacco: Never Used     Comment: form given 06-22-16  . Alcohol use No     Comment: quit sept 2017    Review of Systems Constitutional: No fever/chills Cardiovascular: Denies chest pain. Respiratory: As above.  Gastrointestinal: No current abdominal pain.   Genitourinary: Negative for dysuria. Musculoskeletal: Negative for back pain. Skin: As above.  ____________________________________________   PHYSICAL EXAM:  VITAL SIGNS: ED Triage Vitals  Enc Vitals Group     BP 04/09/17 1222 124/84     Pulse Rate 04/09/17 1222 (!) 132     Resp 04/09/17 1222 20     Temp 04/09/17 1222 98 F (36.7 C)     Temp Source 04/09/17 1222 Oral     SpO2 04/09/17 1222 100 %     Weight 04/09/17 1225 162 lb (73.5 kg)     Height 04/09/17 1225 5' 8.5" (1.74 m)     Head Circumference --      Peak Flow --      Pain Score --      Pain Loc --      Pain Edu? --      Excl. in [place]? --    Vitals:   04/09/17 1222 04/09/17 1225 04/09/17 1318  BP: 124/84    Pulse: (!) 132  (!) 134  Resp: 20  (!) 22  Temp: 98 F (36.7 C)    TempSrc: Oral    SpO2: 100%  100%  Weight:  162 lb (73.5 kg)   Height:  5' 8.5" (1.74 m)     Constitutional: Alert and oriented. Well appearing and in no acute distress. Cardiovascular: Normal rate, regular rhythm. Grossly normal heart sounds.  Good peripheral circulation. Respiratory: Normal respiratory effort without tachypnea nor retractions. Breath sounds are clear and equal bilaterally. No wheezes, rales, rhonchi. Gastrointestinal: Diffusely distended. Nontender. Musculoskeletal:  No midline cervical, thoracic or lumbar tenderness to palpation.  Neurologic:  Normal speech and language. Speech is normal. No gait instability.  Skin:  Skin is warm, dry. Bilateral lower  extremities with dusky reddish coloration with dry scaly skin, bilateral lower extremities 1+ pitting edema. Bilateral dorsalis pedis palpable, and left posterior tibialis palpable, and unable to manually palpate right posterior tibialis, Melissa CMA utilize hand-held ultrasound to locate pulses dorsalis pedis and posterior tibialis bilaterally. Right anterior and lateral shin to ulcerative appearing wounds with purplish erythematous surrounding coloration with diffuse mild to moderate anterior tenderness, no posterior calf tenderness bilaterally, no active drainage noted. No fluctuance. Psychiatric: Mood and affect are normal. Speech and behavior are normal. Patient exhibits appropriate insight and judgment.  ___________________________________________   LABS (all labs ordered are listed, but only abnormal results are displayed)  Labs Reviewed - No data to display ____________________________________________   PROCEDURES Procedures     INITIAL IMPRESSION / ASSESSMENT AND PLAN / ED COURSE  Pertinent labs & imaging results that were available during my care of the patient were reviewed by me and considered in my medical decision making (see chart for details).  Patient with extensive recent medical history, with recent chemotherapy for cholangiocarcinoma. Suspect patient has chronic bilateral venous insufficiency with appearance of venous stasis dermatitis as well as venous ulcer to right leg with suspected surrounding cellulitis. Patient was noted to be tachycardic, patient refused EKG. Encouraged patient to be seen in the emergency room for further evaluation of right leg including laboratory studies, possible ultrasound, evaluation of heartbeat and possible IV antibiotics. Patient and wife verbalized understanding. This plan. Patient declines EMS. Patient states that his wife will take him to [facility]. [facility] are in called and given report. Patient stable at the time of  discharge.   ____________________________________________   FINAL CLINICAL IMPRESSION(S) / ED DIAGNOSES  Final diagnoses:  Right leg pain  Cellulitis of leg, right  Venous stasis dermatitis of both lower extremities  Tachycardia     Discharge Medication List as of 04/09/2017  1:40 PM      Note: This dictation was prepared with Dragon dictation along with smaller phrase technology. Any transcriptional errors that result from this process are unintentional.         Marylene Land, NP 04/09/17 1459

## 2017-04-09 NOTE — ED Notes (Signed)
Wound cleansed and dressing applied.

## 2017-04-10 ENCOUNTER — Telehealth: Payer: Self-pay | Admitting: Family Medicine

## 2017-04-10 DIAGNOSIS — T814XXD Infection following a procedure, subsequent encounter: Principal | ICD-10-CM

## 2017-04-10 DIAGNOSIS — IMO0001 Reserved for inherently not codable concepts without codable children: Secondary | ICD-10-CM

## 2017-04-10 MED ORDER — CLONAZEPAM 1 MG PO TABS: 0.5000 mg | ORAL_TABLET | Freq: Two times a day (BID) | ORAL | 2 refills | Status: AC | PRN

## 2017-04-10 NOTE — Telephone Encounter (Signed)
Pts wife, June, called and would like to request something to help the pt sleep. She has been giving him 2 unisom in the afternoon and 4 at night and stated that this is working for the moment. She would also like to have something for anxiety. Pt is willing to come in for an appt and will schedule if advised to do so.

## 2017-04-10 NOTE — Telephone Encounter (Signed)
Referral to wound clinic

## 2017-04-10 NOTE — Telephone Encounter (Signed)
Daughter called to request referral for would care (in house preferred). Pt was seen at Urgent Care, who sent him to E.R. And E.R. Note is not complete. Per UC note pt was removing compression stockings and it ripped the skin, wound has continued to worsen. Please advise.

## 2017-04-10 NOTE — Telephone Encounter (Signed)
Pts wife would like to know if he could have a home health aide come out to the home and if they could do the wound care in the home. She is interested in [place].

## 2017-04-10 NOTE — Telephone Encounter (Signed)
Ongoing severe anxiety will prescribe clonazepam patient undergoing cancer therapy.

## 2017-04-10 NOTE — Telephone Encounter (Signed)
Referral form completed and placed in providers box for signature, will then fax.

## 2017-04-10 NOTE — Telephone Encounter (Signed)
Called and spoke with Mrs Richard Whitehead and she stated that she used [facility].

## 2017-04-12 ENCOUNTER — Other Ambulatory Visit: Payer: Self-pay | Admitting: Family Medicine

## 2017-04-12 ENCOUNTER — Other Ambulatory Visit: Payer: Self-pay

## 2017-04-12 ENCOUNTER — Telehealth: Payer: Self-pay | Admitting: Gastroenterology

## 2017-04-12 ENCOUNTER — Telehealth: Payer: Self-pay

## 2017-04-12 DIAGNOSIS — K7031 Alcoholic cirrhosis of liver with ascites: Secondary | ICD-10-CM

## 2017-04-12 NOTE — Telephone Encounter (Signed)
I reviewed the ED note and subsequent notes from PCP.  Cellulitis was treated with antibiotics.  Home health will be seeing him.  He seems to be having trouble with worsening fluid retention. Please get me a phone number for his oncologist at [facility] so I can discuss this with them.

## 2017-04-12 NOTE — Telephone Encounter (Signed)
Just to verify, no albumin needed?

## 2017-04-12 NOTE — Telephone Encounter (Signed)
Spoke to patient he is scheduled weekly for paracentesis.

## 2017-04-12 NOTE — Telephone Encounter (Signed)
Yes, of course.  Please get him on for tomorrow for a 5-7 liter paracentesis.  Do not take the diuretics that day.  Then get him on the schedule weekly for the next 4 weeks for same.

## 2017-04-12 NOTE — Telephone Encounter (Signed)
No albumin needed.

## 2017-04-12 NOTE — Telephone Encounter (Signed)
In [place] found this information, also called and confirmed with their office:  Dr. Nicky Pugh at [facility], [phone] (office number).

## 2017-04-12 NOTE — Telephone Encounter (Signed)
[facility] Called Verbal order given per CW for unna boot twice a week and eval for PT and OT.

## 2017-04-12 NOTE — Telephone Encounter (Signed)
Please advise, I did send you the information to contact oncologist.

## 2017-04-13 ENCOUNTER — Ambulatory Visit (HOSPITAL_COMMUNITY)
Admission: RE | Admit: 2017-04-13 | Discharge: 2017-04-13 | Disposition: A | Discharge status: Home / Self Care | Payer: Medicare Other | Source: Ambulatory Visit | Attending: Gastroenterology | Admitting: Gastroenterology

## 2017-04-13 DIAGNOSIS — R188 Other ascites: Secondary | ICD-10-CM | POA: Diagnosis not present

## 2017-04-13 DIAGNOSIS — K7031 Alcoholic cirrhosis of liver with ascites: Secondary | ICD-10-CM

## 2017-04-13 LAB — BODY FLUID CELL COUNT WITH DIFFERENTIAL
LYMPHS FL: 24 %
Monocyte-Macrophage-Serous Fluid: 55 % (ref 50–90)
NEUTROPHIL FLUID: 21 % (ref 0–25)
Total Nucleated Cell Count, Fluid: 244 cu mm (ref 0–1000)

## 2017-04-13 LAB — GRAM STAIN

## 2017-04-13 NOTE — Procedures (Signed)
Ultrasound-guided diagnostic and therapeutic paracentesis performed yielding 6.8 liters of hazy, yellow fluid. No immediate complications. A portion of the fluid was sent to the lab for preordered studies.

## 2017-04-16 NOTE — Telephone Encounter (Signed)
Given the increasing difficulty Richard Whitehead is having with ascites, I spoke with Dr Altamease Oiler about placement of an indwelling peritoneal catheter (Pleur-X) so fluid can be taken out at home by home health.  I called Richard Whitehead today, and he is agreeable to that.  He is also scheduled to have a paracentesis this Thursday AM.  I will send a message to Drs Pascal Lux and Reesa Chew in Interventional Radiology and ask one of them to visit Richard Whitehead while he is in their department Thursday and discuss it with him. Please place an order to IR for the peritoneal catheter.  Please contact his home health agency and find out how we can make arrangements for them to draw the fluid off weekly.  We would give them orders for volume and frequency after the catheter is placed.

## 2017-04-17 LAB — PATHOLOGIST SMEAR REVIEW

## 2017-04-17 NOTE — Addendum Note (Signed)
Addended by: Marlon Pel on: 04/17/2017 09:44 AM   Modules accepted: Orders

## 2017-04-17 NOTE — Telephone Encounter (Signed)
Patient has his home health care through [facility] their phone number is [phone].  I spoke with Tracie Harrier their coordinator and they would not be able to manage the indwelling catheter for the patient, this is not a service that they provide. I placed the order for the consult.  Please advise if you would like to proceed with consult.

## 2017-04-18 LAB — CULTURE, BODY FLUID W GRAM STAIN -BOTTLE: Culture: NO GROWTH

## 2017-04-19 ENCOUNTER — Ambulatory Visit (HOSPITAL_COMMUNITY)
Admission: RE | Admit: 2017-04-19 | Discharge: 2017-04-19 | Disposition: A | Discharge status: Home / Self Care | Payer: Medicare Other | Source: Ambulatory Visit | Attending: Gastroenterology | Admitting: Gastroenterology

## 2017-04-19 DIAGNOSIS — R188 Other ascites: Secondary | ICD-10-CM

## 2017-04-19 DIAGNOSIS — K7031 Alcoholic cirrhosis of liver with ascites: Secondary | ICD-10-CM | POA: Insufficient documentation

## 2017-04-19 DIAGNOSIS — C221 Intrahepatic bile duct carcinoma: Secondary | ICD-10-CM | POA: Diagnosis not present

## 2017-04-19 DIAGNOSIS — K7460 Unspecified cirrhosis of liver: Secondary | ICD-10-CM

## 2017-04-19 NOTE — Procedures (Signed)
Ultrasound-guided diagnostic and therapeutic paracentesis performed yielding 7 liters (maximum ordered) of hazy, yellow  fluid. No immediate complications. Option of tunneled peritoneal cath placement in future d/w pt. He wishes to continue current regimen of OP paracenteses for now.

## 2017-04-19 NOTE — Telephone Encounter (Signed)
Left message for patient to call back I spoke with Odelia Gage, PA at radiology.  He is notified that the catheter placement is on hold, he will still discuss the process if the maintenance of the catheter can me worded out.  The patient will not be able to go to radiology for drainage either.

## 2017-04-19 NOTE — Telephone Encounter (Signed)
Thank you for letting me know. Naturally, we cannot proceed with this catheter unless there is some way to remove the fluid weekly/biweekly as needed.  Perhaps radiology could still do that, and it would save Rip the weekly ultrasound-guided needle.  But that would still mean he has to go to the rads dept.  It seems to me only worth doing if the fluid can be removed at home.  I think Octavia will be in IR this AM for a paracentesis, and the radiologist was planning to talk to him about the catheter. PLEASE call rads this AM and inform them of that before they decide to proceed.  And call [place] to let him know this plan is still pending.  My only other idea is to check with the cancer center and palliative care to see if they know how this service can be set up for home care.  Seyed is currently seeing an oncologist at [facility], but perhaps our cancer center can still be helpful with identifying local resources.  I am out of the office this week and will not be checking my messages and patient-related inbox until I return on 7/16.

## 2017-04-19 NOTE — Telephone Encounter (Signed)
I spoke with [facility], if the patient were to be admitted to them they would manage the ascites via Pleur-X catheter.    I left a message again for the patient to call and discuss that we are working on a solution for managing the ascites via catheter. I have not been able to reach him today to discuss holding on catheter placement until all details about it's maintenance can be worked out.

## 2017-04-20 ENCOUNTER — Ambulatory Visit: Payer: Medicare Other | Admitting: Physician Assistant

## 2017-04-23 ENCOUNTER — Encounter: Payer: Self-pay | Admitting: Family Medicine

## 2017-04-23 NOTE — Telephone Encounter (Signed)
I just received and reviewed the office note from his [facility] oncologist after his visit with them last week. They agree with the placement of a Pleurx catheter and referral to hospice for removal of ascitic fluid through that catheter.  Please continue efforts to reach Richard Whitehead. If he is agreeable to all of that, please place a referral to [facility]ice of [facility] and then help Richard Whitehead make arrangements for placement of the catheter with interventional radiology. Once the catheter was placed and the referral to hospice is established, I can give hospice services orders for fluid removal.

## 2017-04-23 NOTE — Telephone Encounter (Signed)
Called patient again today, left message for him to call our office back.

## 2017-04-24 NOTE — Telephone Encounter (Signed)
Understood, thanks. We will wait to hear from hospice regarding orders for fluid removal.  And we will wait for Richard Whitehead so we can arrange catheter placement with IR.

## 2017-04-24 NOTE — Telephone Encounter (Signed)
I have still not heard back from patient. Looking at [place] message in chart, they have placed the referral to [facility].

## 2017-04-26 ENCOUNTER — Ambulatory Visit (HOSPITAL_COMMUNITY)
Admission: RE | Admit: 2017-04-26 | Discharge: 2017-04-26 | Disposition: A | Discharge status: Home / Self Care | Payer: Medicare Other | Source: Ambulatory Visit | Attending: Gastroenterology | Admitting: Gastroenterology

## 2017-04-26 DIAGNOSIS — K7031 Alcoholic cirrhosis of liver with ascites: Secondary | ICD-10-CM

## 2017-04-26 DIAGNOSIS — R188 Other ascites: Secondary | ICD-10-CM | POA: Diagnosis not present

## 2017-04-26 LAB — BODY FLUID CELL COUNT WITH DIFFERENTIAL
LYMPHS FL: 19 %
MONOCYTE-MACROPHAGE-SEROUS FLUID: 73 % (ref 50–90)
NEUTROPHIL FLUID: 8 % (ref 0–25)
WBC FLUID: 439 uL (ref 0–1000)

## 2017-04-26 LAB — PATHOLOGIST SMEAR REVIEW

## 2017-04-26 NOTE — Procedures (Signed)
PROCEDURE SUMMARY:  Successful US guided paracentesis from right lateral abdomen.  Yielded 4.9 liters of cloudy yellow fluid.  No immediate complications.  Pt tolerated well.   Specimen was sent for labs.  Mervil Wacker S Kellsey Sansone PA-C 04/26/2017 11:08 AM

## 2017-04-30 ENCOUNTER — Other Ambulatory Visit: Payer: Self-pay

## 2017-04-30 DIAGNOSIS — K7031 Alcoholic cirrhosis of liver with ascites: Secondary | ICD-10-CM

## 2017-04-30 NOTE — Telephone Encounter (Signed)
Patient called back this morning, he states he would like to have paracentesis done tomorrow and wants at least 7 liters of fluid removed. He states he is having trouble urinating. He was not sure if he even wanted the Pleurx catheter placed, states that the hospice would not come back to manage the fluid removal. After talking to patient, he is agreeable to the catheter placement, will work on getting this set up ASAP. I did call [facility] [phone]) and spoke to [facility], Intake RN and they do manage fluid removal for these types of catheters. Her direct number is: [phone]. Their fax #: [phone]. I told her that once catheter is placed I would call/fax orders. Spoke to [facility] IR, they cannot do this until 8/3, I have called and left a message for [facility] IR to see if they can get this scheduled sooner.  In the meantime, if IR cannot get this scheduled sooner, patient is wanting a higher volume paracentesis. Please advise. Thank you.

## 2017-04-30 NOTE — Telephone Encounter (Signed)
Patient is scheduled for IR Pleurx catheter placement on 7/26. What orders do you want me to send to [facility]?

## 2017-04-30 NOTE — Telephone Encounter (Signed)
I am concerned that the fluid is re-accumulating so quickly (less than a week) and that he has trouble urinating. Since the catheter cannot be placed yet, he needs paracentesis today or tomorrow.  I am not comfortable removing more than 7 liters at once because of the risk affecting his kidney function. If IR cannot do a 7 liter paracentesis by tomorrow, then he needs to go to the [facility] ED.  I also want him to get a BMP drawn today or tomorrow.

## 2017-04-30 NOTE — Telephone Encounter (Addendum)
Spoke to [place] at central scheduling, she has moved up patient's US paracentesis to 7/24 at 10:00 [facility] radiology, to remove no more than 7 liters. Called patient to let him know, had to lvm. Called and spoke to patient's wife, let her know about appointment, reminded her that Bird will need to hold his diuretics tomorrow, as usual. He will also need to get lab work done tomorrow. I will call her to let her know when they have scheduled the catheter placement, and that [place] will be managing the fluid withdrawal at their home.

## 2017-05-01 ENCOUNTER — Ambulatory Visit (HOSPITAL_COMMUNITY)
Admission: RE | Admit: 2017-05-01 | Discharge: 2017-05-01 | Disposition: A | Discharge status: Home / Self Care | Payer: Medicare Other | Source: Ambulatory Visit | Attending: Gastroenterology | Admitting: Gastroenterology

## 2017-05-01 DIAGNOSIS — K7031 Alcoholic cirrhosis of liver with ascites: Secondary | ICD-10-CM | POA: Diagnosis not present

## 2017-05-01 DIAGNOSIS — R188 Other ascites: Secondary | ICD-10-CM | POA: Diagnosis not present

## 2017-05-01 LAB — BODY FLUID CELL COUNT WITH DIFFERENTIAL
Lymphs, Fluid: 27 %
Monocyte-Macrophage-Serous Fluid: 66 % (ref 50–90)
Neutrophil Count, Fluid: 7 % (ref 0–25)
Total Nucleated Cell Count, Fluid: 364 cu mm (ref 0–1000)

## 2017-05-01 LAB — PATHOLOGIST SMEAR REVIEW

## 2017-05-01 NOTE — Telephone Encounter (Signed)
Spoke to [facility], let them know when the pleurx catheter is being placed on 7/26. Faxed over orders regarding fluid removal. Fax: [phone].

## 2017-05-01 NOTE — Telephone Encounter (Signed)
I see than radiology was only able to get three liters out today. I will also wait to see his BMP results.  Starting next Monday,July 30th, I would like hospice to remove 5 liters of ascites via the pleurx peritoneal catheter evey Monday and Friday. If they are only able to remove a volume less than 5 liters at any session because it is all that will come out, then that is fine.   Also, I want Mykai to stop his spironolactone and furosemide for good after his dose on Sunday, July 29th.  I do not think he will need it since we will be removing fluid twice a week.

## 2017-05-01 NOTE — Procedures (Signed)
PROCEDURE SUMMARY:  Successful US guided diagnostic and therapeutic paracentesis from right lateral abdomen.  Patient does have a 7L maximum.  He typically drains 4-5 liters at each visit.  He has plans for Pleurx catheter placement on Thursday at [facility].  Discussed the merits of draining some fluid to help alleviate his current symptoms of inability eat, inability to void, and shortness of breath while leaving adequate fluid for safe approach on Thursday.  Patient agreeable. Procedure yielded 3.0 liters of hazy, yellow fluid.  No immediate complications.  Pt tolerated well.   Specimen was sent for labs.  Docia Barrier PA-C 05/01/2017 10:07 AM

## 2017-05-01 NOTE — Telephone Encounter (Signed)
Called patient, had to lvm, to confirm appointment for 7/26 for pleurx catheter placement. Also I do not see where he came over to get labs done, remind him to get labwork. Also about plan to stop spironolactone and furosemide, last dose on 7/29. I let him know that hospice will be calling them to set up time when they will be there on Mondays and Fridays. I asked that he or his wife call back to confirm they got this message.

## 2017-05-02 ENCOUNTER — Other Ambulatory Visit: Payer: Self-pay | Admitting: General Surgery

## 2017-05-02 ENCOUNTER — Telehealth: Payer: Self-pay | Admitting: Gastroenterology

## 2017-05-02 ENCOUNTER — Other Ambulatory Visit: Payer: Self-pay | Admitting: Physician Assistant

## 2017-05-02 NOTE — Telephone Encounter (Signed)
Left message for patient's wife since I have not received a call back from patient. I just wanted to verify that they have the information about the catheter placement and when hospice will start removing fluid. Also his last dose of spironolactone and furosemide should be Sunday.

## 2017-05-02 NOTE — Telephone Encounter (Signed)
Spoke to June, patient's wife, let her know that I do not see where Jakeim had his labs done, asked that he please get that done tomorrow. She has not heard from the hospice group yet, gave her their phone number and she will contact them. She understands that patient's last dose of diuretics will be on Sunday.

## 2017-05-02 NOTE — Telephone Encounter (Signed)
Patient wife returning Julie's call

## 2017-05-03 ENCOUNTER — Ambulatory Visit (HOSPITAL_COMMUNITY): Payer: Medicare Other

## 2017-05-03 ENCOUNTER — Ambulatory Visit (HOSPITAL_COMMUNITY)
Admission: RE | Admit: 2017-05-03 | Discharge: 2017-05-03 | Disposition: A | Discharge status: Home / Self Care | Payer: Medicare Other | Source: Ambulatory Visit | Attending: Gastroenterology | Admitting: Gastroenterology

## 2017-05-03 ENCOUNTER — Encounter (HOSPITAL_COMMUNITY): Payer: Self-pay | Admitting: Interventional Radiology

## 2017-05-03 ENCOUNTER — Telehealth: Payer: Self-pay | Admitting: Gastroenterology

## 2017-05-03 DIAGNOSIS — K7460 Unspecified cirrhosis of liver: Secondary | ICD-10-CM | POA: Diagnosis not present

## 2017-05-03 DIAGNOSIS — R188 Other ascites: Secondary | ICD-10-CM | POA: Diagnosis not present

## 2017-05-03 DIAGNOSIS — R160 Hepatomegaly, not elsewhere classified: Secondary | ICD-10-CM | POA: Insufficient documentation

## 2017-05-03 HISTORY — PX: IR GUIDED DRAIN W CATHETER PLACEMENT: IMG719

## 2017-05-03 LAB — CBC
HCT: 40.3 % (ref 39.0–52.0)
Hemoglobin: 13.7 g/dL (ref 13.0–17.0)
MCH: 34.6 pg — ABNORMAL HIGH (ref 26.0–34.0)
MCHC: 34 g/dL (ref 30.0–36.0)
MCV: 101.8 fL — ABNORMAL HIGH (ref 78.0–100.0)
PLATELETS: 189 10*3/uL (ref 150–400)
RBC: 3.96 MIL/uL — ABNORMAL LOW (ref 4.22–5.81)
RDW: 19.8 % — AB (ref 11.5–15.5)
WBC: 10 10*3/uL (ref 4.0–10.5)

## 2017-05-03 LAB — BASIC METABOLIC PANEL
Anion gap: 9 (ref 5–15)
BUN: 25 mg/dL — AB (ref 6–20)
CALCIUM: 9.2 mg/dL (ref 8.9–10.3)
CHLORIDE: 97 mmol/L — AB (ref 101–111)
CO2: 26 mmol/L (ref 22–32)
Creatinine, Ser: 0.96 mg/dL (ref 0.61–1.24)
GFR calc Af Amer: >60 mL/min (ref >60)
GLUCOSE: 114 mg/dL — AB (ref 65–99)
Potassium: 3.9 mmol/L (ref 3.5–5.1)
SODIUM: 132 mmol/L — AB (ref 135–145)

## 2017-05-03 LAB — APTT: APTT: 36 s (ref 24–36)

## 2017-05-03 LAB — PROTIME-INR
INR: 0.98
Prothrombin Time: 13 seconds (ref 11.4–15.2)

## 2017-05-03 MED ORDER — SODIUM CHLORIDE 0.9 % IV SOLN
INTRAVENOUS | Status: DC
  Administered 2017-05-03: 09:00:00 via INTRAVENOUS

## 2017-05-03 MED ORDER — MIDAZOLAM HCL 2 MG/2ML IJ SOLN
INTRAMUSCULAR | Status: AC
  Filled 2017-05-03: qty 2

## 2017-05-03 MED ORDER — FENTANYL CITRATE (PF) 100 MCG/2ML IJ SOLN
INTRAMUSCULAR | Status: AC
  Filled 2017-05-03: qty 2

## 2017-05-03 MED ORDER — LIDOCAINE HCL (PF) 1 % IJ SOLN
INTRAMUSCULAR | Status: AC
  Filled 2017-05-03: qty 30

## 2017-05-03 MED ORDER — CEFAZOLIN SODIUM-DEXTROSE 2-4 GM/100ML-% IV SOLN
INTRAVENOUS | Status: AC
  Administered 2017-05-03: 2000 mg via INTRAVENOUS
  Filled 2017-05-03: qty 100

## 2017-05-03 MED ORDER — CEFAZOLIN SODIUM-DEXTROSE 2-4 GM/100ML-% IV SOLN
2.0000 g | Freq: Once | INTRAVENOUS | Status: AC
  Administered 2017-05-03: 2000 mg via INTRAVENOUS

## 2017-05-03 MED ORDER — LIDOCAINE HCL 1 % IJ SOLN
INTRAMUSCULAR | Status: AC | PRN
  Administered 2017-05-03: 15 mL

## 2017-05-03 NOTE — Sedation Documentation (Signed)
No sedation medications given, pt denies any pain or discomfort during procedure, MAE.

## 2017-05-03 NOTE — Progress Notes (Signed)
Patient ID: Richard Whitehead, male   DOB: 01/03/1946, 71 y.o.   MRN: [id number] Patient presents today for placement of abdominal PleurX catheter for recurrent abdominal ascites.  He does not want sedation, therefore, we will proceed without.  His labs and vitals have been reviewed. Risks and Benefits discussed with the patient including bleeding, infection, damage to adjacent structures, bowel perforation.. All of the patient's questions were answered, patient is agreeable to proceed. Consent signed and in chart.  Justyce Yeater E 7:53 AM 05/03/2017

## 2017-05-03 NOTE — Telephone Encounter (Signed)
Thanks for letting me know.  I just reviewed it, and his kidney function, sodium level and potassium level are all normal.  So we will stay with the twice weekly  paracentesis plan by hospice as outlined previously. A week after starting that, I would like him to have a BMP and results to me.

## 2017-05-03 NOTE — Sedation Documentation (Signed)
Patient denies pain and is resting comfortably.  

## 2017-05-03 NOTE — Telephone Encounter (Signed)
Looks like he had his lab work done this morning, but it does not appear that they drew it under our order so not sure you will get this in result box.

## 2017-05-03 NOTE — Sedation Documentation (Signed)
Patient is resting comfortably. 

## 2017-05-03 NOTE — Sedation Documentation (Signed)
Pt not wanting sedation medication at this time, however Richard Whitehead did request an RN on stand by in the event he decides he would like sedation medication during procedure.

## 2017-05-03 NOTE — Procedures (Signed)
  Procedure:   Abdominal PleurEx drain placement   Preprocedure diagnosis:  Ascites Postprocedure diagnosis:  same EBL:     minimal Complications:   none immediate  See full dictation in [facility].  Dillard Cannon MD Main # [phone] Pager  [phone]

## 2017-05-03 NOTE — Telephone Encounter (Signed)
Faxed order to [facility] to draw BMP one week after starting the ascites fluid removal through catheter.

## 2017-05-07 NOTE — Telephone Encounter (Signed)
Spoke to patient, he wanted to let us know that the radiology department didn't give him any extra bandages. I told him that he could certainly ask the hospice nurse for them.

## 2017-05-07 NOTE — Telephone Encounter (Signed)
Patient states that he is returning Julie's call. He says that this is regarding the catheter that was inserted in stomach last week. Best # [phone].He says that he was not given any bandages for this.

## 2017-05-10 ENCOUNTER — Ambulatory Visit (HOSPITAL_COMMUNITY): Payer: Medicare Other

## 2017-05-14 ENCOUNTER — Telehealth: Payer: Self-pay | Admitting: Family Medicine

## 2017-05-14 NOTE — Telephone Encounter (Signed)
Called pt to schedule for Annual Wellness Visit with Nurse Health Advisor, Tiffany Hill, my c/b # is [phone]  Kathryn Brown ° °

## 2017-05-15 ENCOUNTER — Telehealth: Payer: Self-pay | Admitting: Gastroenterology

## 2017-05-15 NOTE — Telephone Encounter (Signed)
BMP lab from hospice drawn yesterday shows normal kidney function. So we will continue the current twice weekly paracentesis plan with same volume we have been doing.

## 2017-05-16 NOTE — Telephone Encounter (Signed)
2 weeks please

## 2017-05-16 NOTE — Telephone Encounter (Signed)
Faxed order to [facility] to recheck BMP in 2 weeks.

## 2017-05-16 NOTE — Telephone Encounter (Signed)
Patient advised of results and to continue twice weekly paracentesis. When would you like next BMP drawn? Thanks.

## 2017-05-18 ENCOUNTER — Other Ambulatory Visit: Payer: Self-pay

## 2017-05-18 ENCOUNTER — Telehealth: Payer: Self-pay | Admitting: Gastroenterology

## 2017-05-18 DIAGNOSIS — K7031 Alcoholic cirrhosis of liver with ascites: Secondary | ICD-10-CM

## 2017-05-18 DIAGNOSIS — T81328A Disruption or dehiscence of closure of other specified internal operation (surgical) wound, initial encounter: Secondary | ICD-10-CM

## 2017-05-18 DIAGNOSIS — T8132XA Disruption of internal operation (surgical) wound, not elsewhere classified, initial encounter: Secondary | ICD-10-CM

## 2017-05-18 DIAGNOSIS — W171XXA Fall into storm drain or manhole, initial encounter: Secondary | ICD-10-CM

## 2017-05-18 MED ORDER — CEPHALEXIN 500 MG PO CAPS: 500.0000 mg | ORAL_CAPSULE | Freq: Four times a day (QID) | ORAL | 0 refills | Status: AC

## 2017-05-18 NOTE — Addendum Note (Signed)
Addended by: Nelida Meuse on: 05/18/2017 12:48 PM   Modules accepted: Orders

## 2017-05-18 NOTE — Progress Notes (Signed)
done

## 2017-05-18 NOTE — Telephone Encounter (Signed)
Routed to Dr. Danis. 

## 2017-05-18 NOTE — Telephone Encounter (Signed)
Order placed for IR to check drain, possibly replace. Left message for [facility] IR-Jennifer to let her know there is an order and to call patient to schedule. Called Clarene Critchley with [facility] to let her know the plan and that antibiotic has been sent to patient's pharmacy. Spoke to patient and let him know plan and to pick up Rx.

## 2017-05-18 NOTE — Telephone Encounter (Addendum)
I will prescribe some antibiotics in case the site is infected. Order placed through chart - sent to his pharmacy.  But he needs to be seen in the Interventional radiology clinic next week because they placed the catheter and it sounds like it is leaking.  Please help arrange that.

## 2017-05-21 ENCOUNTER — Other Ambulatory Visit: Payer: Self-pay

## 2017-05-22 ENCOUNTER — Telehealth: Payer: Self-pay

## 2017-05-22 ENCOUNTER — Telehealth (HOSPITAL_COMMUNITY): Payer: Self-pay

## 2017-05-22 NOTE — Telephone Encounter (Signed)
IR called and spoke to patient, as of right now pleurx catheter is working properly, patient wanted to hold off possible drain exchange. Hospice nurse to let you know at Friday's visit or sooner if they feel it is not working.

## 2017-05-22 NOTE — Telephone Encounter (Signed)
Received a call from [place] from [place] regarding pt's pleurx catheter leaking. Called pt to schedule appt. Wife stated that it did seem to be leaking at first but home health nurse came in and assessed it and they put the pt on some antibiotics and it seems to be doing better. She did say the color seemed to be off but it was getting better. The home health nurse will be back on Friday 8/17, so she will see what the nurse thinks of the catheter then and give me a call then if there are any problems. AW

## 2017-05-22 NOTE — Telephone Encounter (Signed)
Thanks for letting me know.  If, at some point, there a further concerns about leak or malfunction at the catheter site, they would be best served by communicating directly with IR since they would have to trouble shoot it.

## 2017-05-25 ENCOUNTER — Telehealth: Payer: Self-pay

## 2017-05-25 ENCOUNTER — Other Ambulatory Visit: Payer: Self-pay

## 2017-05-25 NOTE — Telephone Encounter (Signed)
Spoke to patient's wife, she is going to call Levada Dy at IR to discuss with them what is going on. I have asked June to call me back to let me know what IR plans to do.

## 2017-05-25 NOTE — Telephone Encounter (Signed)
Patient of Dr. Loletha Carrow, routed to DOD, [facility] hospice nurse called, reports that today patient's drainage from pleurx catheter for ascites, had a slight odor and was gray, purulent drainage. She is still able to get fluid out. Last week they thought it was infected and placed on Keflex for 7 days, order in to change drainage tube with IR. Patient and IR decided to hold on drain exchange, Dr. Loletha Carrow in last phone note has deferred this to IR. Questioning if patient will need another round of antibiotics. I will contact IR to see about getting the drainage tube replaced.

## 2017-05-25 NOTE — Telephone Encounter (Signed)
I agree with asking IR  We can repeat Abx - I would do something different probably and will but checking to see if IR has advised

## 2017-05-28 ENCOUNTER — Telehealth: Payer: Self-pay | Admitting: Gastroenterology

## 2017-05-28 NOTE — Telephone Encounter (Signed)
Nurse at [facility] home care states they need a PRN order for ascities faxed over for patient. Nurse drained 3 liters yesterday and 3800 ml today off of pt. Pt drainage now a pink color. Please call Teressa at [phone]

## 2017-05-29 NOTE — Telephone Encounter (Signed)
Hospice can remove 3-4 liters up to three times a week as needed to relieve abdominal pain and distension.  I am not surprised that there is blood tinge in the fluid since he has an underlying malignancy.

## 2017-05-29 NOTE — Telephone Encounter (Signed)
I am not sure if Richard Whitehead sent this message to you or spoke to you about getting some PRN orders yesterda. Please let me know, if you have any PRN orders and I will send them over to the hospice nurse. Thank you.

## 2017-05-29 NOTE — Telephone Encounter (Signed)
Faxed over order, this is to replace old order of M and F may drain up to 5 liters. Faxed to [facility] [phone].

## 2017-05-30 ENCOUNTER — Ambulatory Visit (HOSPITAL_COMMUNITY)
Admission: RE | Admit: 2017-05-30 | Discharge: 2017-05-30 | Disposition: A | Discharge status: Home / Self Care | Source: Ambulatory Visit | Attending: Gastroenterology | Admitting: Gastroenterology

## 2017-05-30 ENCOUNTER — Encounter (HOSPITAL_COMMUNITY): Payer: Self-pay | Admitting: General Surgery

## 2017-05-30 DIAGNOSIS — K7460 Unspecified cirrhosis of liver: Secondary | ICD-10-CM | POA: Insufficient documentation

## 2017-05-30 DIAGNOSIS — T85698A Other mechanical complication of other specified internal prosthetic devices, implants and grafts, initial encounter: Secondary | ICD-10-CM | POA: Diagnosis not present

## 2017-05-30 DIAGNOSIS — T85638A Leakage of other specified internal prosthetic devices, implants and grafts, initial encounter: Secondary | ICD-10-CM | POA: Insufficient documentation

## 2017-05-30 DIAGNOSIS — K7031 Alcoholic cirrhosis of liver with ascites: Secondary | ICD-10-CM

## 2017-05-30 DIAGNOSIS — R188 Other ascites: Secondary | ICD-10-CM | POA: Diagnosis not present

## 2017-05-30 DIAGNOSIS — Y829 Unspecified medical devices associated with adverse incidents: Secondary | ICD-10-CM | POA: Insufficient documentation

## 2017-05-30 DIAGNOSIS — T8132XA Disruption of internal operation (surgical) wound, not elsewhere classified, initial encounter: Secondary | ICD-10-CM

## 2017-05-30 HISTORY — PX: IR RADIOLOGIST EVAL & MGMT: IMG5224

## 2017-05-31 ENCOUNTER — Ambulatory Visit (INDEPENDENT_AMBULATORY_CARE_PROVIDER_SITE_OTHER): Payer: Medicare Other

## 2017-05-31 ENCOUNTER — Ambulatory Visit (HOSPITAL_COMMUNITY): Payer: Medicare Other

## 2017-05-31 VITALS — BP 118/62 | HR 62 | Resp 16 | Ht 68.0 in | Wt 153.6 lb

## 2017-05-31 DIAGNOSIS — Z1211 Encounter for screening for malignant neoplasm of colon: Secondary | ICD-10-CM | POA: Diagnosis not present

## 2017-05-31 DIAGNOSIS — Z0000 Encounter for general adult medical examination without abnormal findings: Secondary | ICD-10-CM

## 2017-05-31 NOTE — Patient Instructions (Addendum)
Richard Whitehead , Thank you for taking time to come for your Medicare Wellness Visit. I appreciate your ongoing commitment to your health goals. Please review the following plan we discussed and let me know if I can assist you in the future.   Screening recommendations/referrals: Colonoscopy: due- ordered cologuard today Recommended yearly ophthalmology/optometry visit for glaucoma screening and checkup Recommended yearly dental visit for hygiene and checkup  Vaccinations: Influenza vaccine: up to date, due 06/2017 Pneumococcal vaccine: up to date Tdap vaccine: up to date Shingles vaccine: up to date  Advanced directives: Please bring a copy of your health care power of attorney and living will to the office at your convenience.  Conditions/risks identified: .smoking cessation discussed  Next appointment: Follow up on 07/26/2017 at 10:00am with Dr.Crissman. Follow up in one year for your annual wellness exam.   Preventive Care 65 Years and Older, Male Preventive care refers to lifestyle choices and visits with your health care provider that can promote health and wellness. What does preventive care include?  A yearly physical exam. This is also called an annual well check.  Dental exams once or twice a year.  Routine eye exams. Ask your health care provider how often you should have your eyes checked.  Personal lifestyle choices, including:  Daily care of your teeth and gums.  Regular physical activity.  Eating a healthy diet.  Avoiding tobacco and drug use.  Limiting alcohol use.  Practicing safe sex.  Taking low doses of aspirin every day.  Taking vitamin and mineral supplements as recommended by your health care provider. What happens during an annual well check? The services and screenings done by your health care provider during your annual well check will depend on your age, overall health, lifestyle risk factors, and family history of disease. Counseling  Your  health care provider may ask you questions about your:  Alcohol use.  Tobacco use.  Drug use.  Emotional well-being.  Home and relationship well-being.  Sexual activity.  Eating habits.  History of falls.  Memory and ability to understand (cognition).  Work and work Statistician. Screening  You may have the following tests or measurements:  Height, weight, and BMI.  Blood pressure.  Lipid and cholesterol levels. These may be checked every 5 years, or more frequently if you are over 51 years old.  Skin check.  Lung cancer screening. You may have this screening every year starting at age 37 if you have a 30-pack-year history of smoking and currently smoke or have quit within the past 15 years.  Fecal occult blood test (FOBT) of the stool. You may have this test every year starting at age 41.  Flexible sigmoidoscopy or colonoscopy. You may have a sigmoidoscopy every 5 years or a colonoscopy every 10 years starting at age 10.  Prostate cancer screening. Recommendations will vary depending on your family history and other risks.  Hepatitis C blood test.  Hepatitis B blood test.  Sexually transmitted disease (STD) testing.  Diabetes screening. This is done by checking your blood sugar (glucose) after you have not eaten for a while (fasting). You may have this done every 1-3 years.  Abdominal aortic aneurysm (AAA) screening. You may need this if you are a current or former smoker.  Osteoporosis. You may be screened starting at age 53 if you are at high risk. Talk with your health care provider about your test results, treatment options, and if necessary, the need for more tests. Vaccines  Your health care provider  may recommend certain vaccines, such as:  Influenza vaccine. This is recommended every year.  Tetanus, diphtheria, and acellular pertussis (Tdap, Td) vaccine. You may need a Td booster every 10 years.  Zoster vaccine. You may need this after age  83.  Pneumococcal 13-valent conjugate (PCV13) vaccine. One dose is recommended after age 14.  Pneumococcal polysaccharide (PPSV23) vaccine. One dose is recommended after age 78. Talk to your health care provider about which screenings and vaccines you need and how often you need them. This information is not intended to replace advice given to you by your health care provider. Make sure you discuss any questions you have with your health care provider. Document Released: 10/22/2015 Document Revised: 06/14/2016 Document Reviewed: 07/27/2015 Elsevier Interactive Patient Education  2017 [place] Prevention in the Home Falls can cause injuries. They can happen to people of all ages. There are many things you can do to make your home safe and to help prevent falls. What can I do on the outside of my home?  Regularly fix the edges of walkways and driveways and fix any cracks.  Remove anything that might make you trip as you walk through a door, such as a raised step or threshold.  Trim any bushes or trees on the path to your home.  Use bright outdoor lighting.  Clear any walking paths of anything that might make someone trip, such as rocks or tools.  Regularly check to see if handrails are loose or broken. Make sure that both sides of any steps have handrails.  Any raised decks and porches should have guardrails on the edges.  Have any leaves, snow, or ice cleared regularly.  Use sand or salt on walking paths during winter.  Clean up any spills in your garage right away. This includes oil or grease spills. What can I do in the bathroom?  Use night lights.  Install grab bars by the toilet and in the tub and shower. Do not use towel bars as grab bars.  Use non-skid mats or decals in the tub or shower.  If you need to sit down in the shower, use a plastic, non-slip stool.  Keep the floor dry. Clean up any water that spills on the floor as soon as it happens.  Remove  soap buildup in the tub or shower regularly.  Attach bath mats securely with double-sided non-slip rug tape.  Do not have throw rugs and other things on the floor that can make you trip. What can I do in the bedroom?  Use night lights.  Make sure that you have a light by your bed that is easy to reach.  Do not use any [place] or blankets that are too big for your bed. They should not hang down onto the floor.  Have a firm chair that has side arms. You can use this for support while you get dressed.  Do not have throw rugs and other things on the floor that can make you trip. What can I do in the kitchen?  Clean up any spills right away.  Avoid walking on wet floors.  Keep items that you use a lot in easy-to-reach places.  If you need to reach something above you, use a strong step stool that has a grab bar.  Keep electrical cords out of the way.  Do not use floor polish or wax that makes floors slippery. If you must use wax, use non-skid floor wax.  Do not have throw rugs  and other things on the floor that can make you trip. What can I do with my stairs?  Do not leave any items on the stairs.  Make sure that there are handrails on both sides of the stairs and use them. Fix handrails that are broken or loose. Make sure that handrails are as long as the stairways.  Check any carpeting to make sure that it is firmly attached to the stairs. Fix any carpet that is loose or worn.  Avoid having throw rugs at the top or bottom of the stairs. If you do have throw rugs, attach them to the floor with carpet tape.  Make sure that you have a light switch at the top of the stairs and the bottom of the stairs. If you do not have them, ask someone to add them for you. What else can I do to help prevent falls?  Wear shoes that:  Do not have high heels.  Have rubber bottoms.  Are comfortable and fit you well.  Are closed at the toe. Do not wear sandals.  If you use a  stepladder:  Make sure that it is fully opened. Do not climb a closed stepladder.  Make sure that both sides of the stepladder are locked into place.  Ask someone to hold it for you, if possible.  Clearly mark and make sure that you can see:  Any grab bars or handrails.  First and last steps.  Where the edge of each step is.  Use tools that help you move around (mobility aids) if they are needed. These include:  Canes.  Walkers.  Scooters.  Crutches.  Turn on the lights when you go into a dark area. Replace any light bulbs as soon as they burn out.  Set up your furniture so you have a clear path. Avoid moving your furniture around.  If any of your floors are uneven, fix them.  If there are any pets around you, be aware of where they are.  Review your medicines with your doctor. Some medicines can make you feel dizzy. This can increase your chance of falling. Ask your doctor what other things that you can do to help prevent falls. This information is not intended to replace advice given to you by your health care provider. Make sure you discuss any questions you have with your health care provider. Document Released: 07/22/2009 Document Revised: 03/02/2016 Document Reviewed: 10/30/2014 Elsevier Interactive Patient Education  2017 [employer].

## 2017-05-31 NOTE — Progress Notes (Cosign Needed)
Subjective:   Richard Whitehead is a 71 y.o. male who presents for Medicare Annual/Subsequent preventive examination.  Review of Systems: Cardiac Risk Factors include: male gender;advanced age (>59men, >77 women);hypertension     Objective:    Vitals: BP 118/62 (BP Location: Right Arm, Patient Position: Sitting)   Pulse 62   Resp 16   Ht 5\' 8"  (1.727 m)   Wt 153 lb 9.6 oz (69.7 kg)   BMI 23.35 kg/m   Body mass index is 23.35 kg/m. Dictation #1 [id number]  [id number]  Tobacco History  Smoking Status  . Current Every Day Smoker  . Packs/day: 1.00  . Years: 50.00  . Types: Cigarettes  Smokeless Tobacco  . Never Used    Comment: form given 06-22-16     Ready to quit: No Counseling given: No   Past Medical History:  Diagnosis Date  . Allergy   . Cataract   . 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 Cirrhosis ([place])   . COPD (chronic obstructive pulmonary disease) ([place])   . Hyperglycemia   . Hypertension   . Substance abuse   . Ulcer    Past Surgical History:  Procedure Laterality Date  . IR GUIDED DRAIN W CATHETER PLACEMENT  05/03/2017  . IR PARACENTESIS  04/06/2017  . IR RADIOLOGIST EVAL & MGMT  05/30/2017  . TONSILLECTOMY    . VASECTOMY     Family History  Problem Relation Age of Onset  . Pancreatic cancer Mother        spread to liver; died age 86  . Cancer Mother 70       liver cancer   . Heart attack Paternal Grandfather   . Colon cancer Neg Hx    History  Sexual Activity  . Sexual activity: Not on file    Outpatient Encounter Prescriptions as of 05/31/2017  Medication Sig  . Ascorbic Acid (VITAMIN C) 1000 MG tablet Take 1,000 mg by mouth daily.  . B COMPLEX VITAMINS PO Take 1 tablet by mouth daily.   Marland Kitchen BIOTIN PO Take 1 capsule by mouth daily.  . cetirizine (ZYRTEC) 10 MG tablet Take 10 mg by mouth daily.  Marland Kitchen docusate sodium (COLACE) 100 MG capsule Take 100 mg by mouth 2 (two) times daily.  . MULTIPLE VITAMIN PO Take 1 tablet by mouth daily.     . Omega-3 Fatty Acids (FISH OIL PO) Take 1 capsule by mouth daily.  Marland Kitchen SPIRIVA RESPIMAT 1.25 MCG/ACT AERS INHALE 1 PUFF INTO THE LUNGS EVERY MORNING.  . VITAMIN E PO Take 1 capsule by mouth daily.   . Zinc 100 MG TABS Take 100 mg by mouth daily.   . clonazePAM (KLONOPIN) 1 MG tablet Take 0.5 tablets (0.5 mg total) by mouth 2 (two) times daily as needed for anxiety. (Patient not taking: Reported on 05/31/2017)  . doxylamine, Sleep, (UNISOM) 25 MG tablet Take 25 mg by mouth at bedtime as needed for sleep.  . furosemide (LASIX) 40 MG tablet TAKE 1 TABLET BY MOUTH ONCE DAILY (Patient not taking: Reported on 05/31/2017)  . spironolactone (ALDACTONE) 100 MG tablet Take 1 tablet (100 mg total) by mouth daily. (Patient not taking: Reported on 05/31/2017)  . [DISCONTINUED] amoxicillin-clavulanate (AUGMENTIN) 500-125 MG tablet Take 1 tablet (500 mg total) by mouth every 8 (eight) hours. (Patient not taking: Reported on 05/31/2017)  . [DISCONTINUED] carvedilol (COREG) 12.5 MG tablet Take 1 tablet (12.5 mg total) by mouth 2 (two) times daily with a meal. (Patient not taking: Reported on 04/09/2017)  . [  DISCONTINUED] senna (SENOKOT) 8.6 MG TABS tablet Take 1 tablet by mouth daily as needed for mild constipation.    No facility-administered encounter medications on file as of 05/31/2017.     Activities of Daily Living In your present state of health, do you have any difficulty performing the following activities: 05/31/2017 05/03/2017  Hearing? N N  Comment - -  Vision? N N  Difficulty concentrating or making decisions? N N  Walking or climbing stairs? N N  Dressing or bathing? N N  Doing errands, shopping? N -  Preparing Food and eating ? N -  Using the Toilet? N -  In the past six months, have you accidently leaked urine? N -  Do you have problems with loss of bowel control? N -  Managing your Medications? N -  Managing your Finances? N -  Housekeeping or managing your Housekeeping? N -  Some recent data  might be hidden    Patient Care Team: Guadalupe Maple, MD as PCP - General (Family Medicine) Danis, Kirke Corin, MD as Consulting Physician (Gastroenterology) Truitt Merle, MD as Consulting Physician (Hematology)   Assessment:     Exercise Activities and Dietary recommendations Current Exercise Habits: The patient has a physically strenous job, but has no regular exercise apart from work.  Goals    . Quit smoking / using tobacco          Smoking cesastion discussed      Fall Risk Fall Risk  05/31/2017 11/14/2016 06/21/2016 06/01/2015  Falls in the past year? Yes No No No  Number falls in past yr: 1 - - -  Injury with Fall? Yes - - -  Risk Factor Category  High Fall Risk - - -  Follow up Falls prevention discussed - - -   Depression Screen PHQ 2/9 Scores 05/31/2017 06/21/2016 06/01/2015  PHQ - 2 Score 0 0 0    Cognitive Function     6CIT Screen 05/31/2017  What Year? 0 points  What month? 0 points  What time? 0 points  Count back from 20 0 points  Months in reverse 0 points  Repeat phrase 0 points  Total Score 0    Immunization History  Administered Date(s) Administered  . Hep A / Hep B 08/08/2016, 09/12/2016, 02/08/2017  . Influenza, High Dose Seasonal PF 06/21/2016  . Influenza-Unspecified 07/12/2015  . Pneumococcal Conjugate-13 05/07/2014  . Pneumococcal-Unspecified 08/10/2004, 09/01/2009  . Td 09/01/2009  . Zoster 07/07/2008   Screening Tests Health Maintenance  Topic Date Due  . INFLUENZA VACCINE  05/09/2017  . COLONOSCOPY  06/15/2017 (Originally 11/11/1993)  . TETANUS/TDAP  09/02/2019  . Hepatitis C Screening  Completed  . PNA vac Low Risk Adult  Completed      Plan:    I have personally reviewed and addressed the Medicare Annual Wellness questionnaire and have noted the following in the patient's chart:  A. Medical and social history B. Use of alcohol, tobacco or illicit drugs  C. Current medications and supplements D. Functional ability and status E.   Nutritional status F.  Physical activity G. Advance directives H. List of other physicians I.  Hospitalizations, surgeries, and ER visits in previous 12 months J.  [place] such as hearing and vision if needed, cognitive and depression L. Referrals and appointments  In addition, I have reviewed and discussed with patient certain preventive protocols, quality metrics, and best practice recommendations. A written personalized care plan for preventive services as well as general  preventive health recommendations were provided to patient.   Signed,  Tyler Aas, LPN Nurse Health Advisor   MD Recommendations:none

## 2017-06-07 ENCOUNTER — Telehealth: Payer: Self-pay | Admitting: Family Medicine

## 2017-06-07 NOTE — Telephone Encounter (Signed)
[facility] called and would like to get clarification on the directions on the pts spiriva as it seems the pt may be taking more than one puff.

## 2017-06-07 NOTE — Telephone Encounter (Signed)
Please advise. I see no documentation about more than 1 puff daily.

## 2017-06-08 NOTE — Telephone Encounter (Signed)
Only should be doing 1 puff a day.

## 2017-06-08 NOTE — Telephone Encounter (Signed)
Pharmacy notified.

## 2017-06-12 DIAGNOSIS — Z1212 Encounter for screening for malignant neoplasm of rectum: Secondary | ICD-10-CM | POA: Diagnosis not present

## 2017-06-12 DIAGNOSIS — Z1211 Encounter for screening for malignant neoplasm of colon: Secondary | ICD-10-CM | POA: Diagnosis not present

## 2017-06-13 LAB — COLOGUARD: COLOGUARD: NEGATIVE

## 2017-06-14 ENCOUNTER — Telehealth: Payer: Self-pay | Admitting: Gastroenterology

## 2017-06-15 NOTE — Telephone Encounter (Signed)
I reviewed a photo sent from hospice RN Mild erythema around catheter  Does not look like cellulitis  Advised monitor for increasing size and/or purulent dc  No abx now

## 2017-06-15 NOTE — Telephone Encounter (Signed)
See if they can take a photo and send it to me [phone]

## 2017-06-15 NOTE — Telephone Encounter (Signed)
Routed to DOD, patient of Dr. Loletha Carrow, spoke to hospice nurse who feels that area around where pleurex catheter is inserted is getting redder. They are still getting clear drainage out of catheter. Nurse did not check to see if patient had fever. Last time patient put on Keflex, which seemed to work well. Please advise, thanks.

## 2017-06-15 NOTE — Telephone Encounter (Signed)
They will send you a picture within the hour. Thank you.

## 2017-06-15 NOTE — Telephone Encounter (Signed)
Spoke to patient's wife, let her know about Dr. Celesta Aver recommendations, advised hospice nurse too.

## 2017-06-18 DIAGNOSIS — H2513 Age-related nuclear cataract, bilateral: Secondary | ICD-10-CM | POA: Diagnosis not present

## 2017-06-18 NOTE — Telephone Encounter (Signed)
It is most likely local skin irritation from the continued drainage.  Increase fluid removal to 5 liters three times a week starting today.  When I am back in the office next week, I would like to speak to the physician for [facility] hospice services regarding Richard Whitehead' prognosis and plan.

## 2017-06-18 NOTE — Telephone Encounter (Signed)
Spoke to [employer] a at [employer], gave verbal order to increase fluid removal to 5 liters three times a week starting today. She reports that one time a week it is usually under one liter but then more next time they drawn off fluid. She said that his hospice attending is his oncologist, Dr. Shirleen Schirmer at [phone]. If you need to reach medical director for the hospice, main number is: [phone] and they can get a message to that doctor.

## 2017-06-21 ENCOUNTER — Telehealth: Payer: Self-pay

## 2017-06-21 NOTE — Telephone Encounter (Signed)
Called and informed pt of negative cologuard results. Results added in quick abstraction.

## 2017-06-26 ENCOUNTER — Encounter: Payer: Medicare Other | Admitting: Family Medicine

## 2017-07-02 DIAGNOSIS — H25812 Combined forms of age-related cataract, left eye: Secondary | ICD-10-CM | POA: Diagnosis not present

## 2017-07-02 DIAGNOSIS — H25811 Combined forms of age-related cataract, right eye: Secondary | ICD-10-CM | POA: Diagnosis not present

## 2017-07-09 ENCOUNTER — Telehealth: Payer: Self-pay | Admitting: Gastroenterology

## 2017-07-09 DIAGNOSIS — H6123 Impacted cerumen, bilateral: Secondary | ICD-10-CM | POA: Diagnosis not present

## 2017-07-09 NOTE — Telephone Encounter (Signed)
Spoke to patient's wife, she said that they have only been getting 250-300 cc out, last couple of times. I have not heard from the hospice nurse recently. His regular nurse was off last week, and another nurse was doing this, regular nurse was out today. They felt the drainage was thicker than usual.

## 2017-07-10 DIAGNOSIS — H25812 Combined forms of age-related cataract, left eye: Secondary | ICD-10-CM | POA: Diagnosis not present

## 2017-07-10 DIAGNOSIS — H2512 Age-related nuclear cataract, left eye: Secondary | ICD-10-CM | POA: Diagnosis not present

## 2017-07-10 NOTE — Telephone Encounter (Signed)
Spoke to [place] from [facility] [phone]), she states that patient has not been seen by Dr. Altamease Oiler, although she is signing the orders except for the pleurex catheter. She finally consented to calling Dr. Randell Loop office to see what is the current prognosis and plan. Please see phone note from 06/14/17 with phone numbers to hospice medical director and Dr. Randell Loop office. Helene Kelp called back, she did leave a message for the nurse navigator, Truman Hayward at [phone], asking for update.

## 2017-07-10 NOTE — Telephone Encounter (Signed)
I do not know why that is happening, but I am concerned that it may be due to progression of his underlying cancer.    I do not know that status of his treatments, if any have lately been given or offered.  Please have hospice communicate with the hospice physician and Tauren's oncologist to get a sense of his prognosis and plan.

## 2017-07-11 DIAGNOSIS — Z23 Encounter for immunization: Secondary | ICD-10-CM | POA: Diagnosis not present

## 2017-07-12 NOTE — Telephone Encounter (Signed)
Hospice nurse said that the Medical director does not see patients that are in the homes, they all have their own physicians (Dr. Altamease Oiler) who ordered hospice and signs the orders plus yourself for the pleurex catheter orders. Patient is concerned mostly about the tubing and lack of output, he is still active at home.

## 2017-07-12 NOTE — Telephone Encounter (Signed)
I am afraid there is nothing I can personally do to check the catheter and determine why it is not draining well, even if Einar were to see me in clinic. It was placed by interventional radiology, and must be managed by them.    If IR's final determination is that the advancing cancer has caused this latest development, and if they are unable to make it function, then it may be time for comfort care.  Any further questions or concerns in that regard must be directed to the patient's oncologist or the hospice medical director.

## 2017-07-12 NOTE — Telephone Encounter (Signed)
I reviewed the last oncology note from July, where all treatment was stopped. If the hospice staff does not have that office note, please fax it to them for their review. Please contact the hospice staff and tell them based on that, it is time for them to get the hospice medical director to speak with Richard Whitehead and his wife and discuss end of life care.

## 2017-07-13 ENCOUNTER — Other Ambulatory Visit: Payer: Self-pay | Admitting: Radiology

## 2017-07-13 ENCOUNTER — Telehealth: Payer: Self-pay | Admitting: Gastroenterology

## 2017-07-13 ENCOUNTER — Other Ambulatory Visit: Payer: Self-pay

## 2017-07-13 ENCOUNTER — Ambulatory Visit (HOSPITAL_COMMUNITY)
Admission: RE | Admit: 2017-07-13 | Discharge: 2017-07-13 | Disposition: A | Discharge status: Home / Self Care | Source: Ambulatory Visit | Attending: Radiology | Admitting: Radiology

## 2017-07-13 DIAGNOSIS — R140 Abdominal distension (gaseous): Secondary | ICD-10-CM | POA: Diagnosis present

## 2017-07-13 DIAGNOSIS — C221 Intrahepatic bile duct carcinoma: Secondary | ICD-10-CM | POA: Insufficient documentation

## 2017-07-13 DIAGNOSIS — R188 Other ascites: Secondary | ICD-10-CM

## 2017-07-13 DIAGNOSIS — K7031 Alcoholic cirrhosis of liver with ascites: Secondary | ICD-10-CM

## 2017-07-13 DIAGNOSIS — K8020 Calculus of gallbladder without cholecystitis without obstruction: Secondary | ICD-10-CM | POA: Diagnosis not present

## 2017-07-13 MED ORDER — LIDOCAINE HCL 1 % IJ SOLN
INTRAMUSCULAR | Status: AC
  Filled 2017-07-13: qty 20

## 2017-07-13 NOTE — Telephone Encounter (Signed)
Thank you.  Given that he is a hospice patient, I had hoped to keep Richard Whitehead out of the emergency room. However,it sounds as if he is in distress.  If IR is unable to see Richard Whitehead today about this drain, which may the case since it is short notice, then Richard Whitehead's wife should take him to the emergency department.  That would be the only way for him to receive urgent attention for this.

## 2017-07-13 NOTE — Telephone Encounter (Signed)
Spoke to [place] at hospice first, patient has gained 27 lbs, abdomen very distended, leg edema. I have also called IR at [facility] spoke to [facility], let her know what was going on, she said they still need an order from Dr. Loletha Carrow in order to evaluate drain. Order placed, I have called patient back asked him to contact them at [phone] to get scheduled for a drain/catheter check. I called Launa Flight., nurse navigator for oncology group at [phone], left a message for to call back and will give her an update.

## 2017-07-13 NOTE — Procedures (Signed)
Ultrasound-guided  therapeutic paracentesis performed yielding 2.2 liters of turbid, light yellow fluid. No immediate complications.

## 2017-07-13 NOTE — Telephone Encounter (Signed)
See other phone note

## 2017-07-13 NOTE — Telephone Encounter (Signed)
Called patient's wife, June, let her know that if IR cannot see Richard Whitehead today the other option would be to go to the ED. She said that IR is contacting the PA to discuss getting him worked in and will call them back. She understands if needed to go to the ED.

## 2017-07-16 ENCOUNTER — Other Ambulatory Visit: Payer: Self-pay | Admitting: Radiology

## 2017-07-16 ENCOUNTER — Telehealth: Payer: Self-pay | Admitting: General Surgery

## 2017-07-16 ENCOUNTER — Other Ambulatory Visit (HOSPITAL_COMMUNITY): Payer: Self-pay | Admitting: Interventional Radiology

## 2017-07-16 DIAGNOSIS — R140 Abdominal distension (gaseous): Secondary | ICD-10-CM

## 2017-07-16 DIAGNOSIS — C221 Intrahepatic bile duct carcinoma: Secondary | ICD-10-CM

## 2017-07-16 DIAGNOSIS — R188 Other ascites: Secondary | ICD-10-CM

## 2017-07-16 NOTE — Progress Notes (Signed)
Patient's wife called today stating that the patient's PleurX catheter was not working again.  His fluid is reaccumulating.  After reviewing Dr. Deniece Portela CT report, we will arrange for the patient to return to have his PleurX removed and replaced in a new position.  The wife is appreciative.  We will try to get the first available appointment either here at [facility] or [facility].    Keydi Giel E 11:11 AM 07/16/2017

## 2017-07-17 ENCOUNTER — Ambulatory Visit (HOSPITAL_COMMUNITY)
Admission: RE | Admit: 2017-07-17 | Discharge: 2017-07-17 | Disposition: A | Discharge status: Home / Self Care | Source: Ambulatory Visit | Attending: Interventional Radiology | Admitting: Interventional Radiology

## 2017-07-17 ENCOUNTER — Encounter (HOSPITAL_COMMUNITY): Payer: Self-pay | Admitting: Interventional Radiology

## 2017-07-17 ENCOUNTER — Other Ambulatory Visit (HOSPITAL_COMMUNITY): Payer: Self-pay | Admitting: Interventional Radiology

## 2017-07-17 DIAGNOSIS — C221 Intrahepatic bile duct carcinoma: Secondary | ICD-10-CM

## 2017-07-17 DIAGNOSIS — R188 Other ascites: Secondary | ICD-10-CM | POA: Diagnosis not present

## 2017-07-17 DIAGNOSIS — T85618A Breakdown (mechanical) of other specified internal prosthetic devices, implants and grafts, initial encounter: Secondary | ICD-10-CM | POA: Insufficient documentation

## 2017-07-17 DIAGNOSIS — R140 Abdominal distension (gaseous): Secondary | ICD-10-CM

## 2017-07-17 DIAGNOSIS — Y712 Prosthetic and other implants, materials and accessory cardiovascular devices associated with adverse incidents: Secondary | ICD-10-CM | POA: Diagnosis not present

## 2017-07-17 HISTORY — PX: IR PERC TUN PERIT CATH WO PORT S&I /IMAG: IMG2327

## 2017-07-17 HISTORY — PX: IR REMOVAL PERM PERITONEAL CATH: IMG2329

## 2017-07-17 MED ORDER — LIDOCAINE-EPINEPHRINE (PF) 2 %-1:200000 IJ SOLN
INTRAMUSCULAR | Status: AC
  Filled 2017-07-17: qty 20

## 2017-07-17 NOTE — Procedures (Signed)
Pre Procedural Dx: Cholangiocarcinoma with recurrent abdominal ascites, now with malfunctioning tunneled peritoneal drain Post Procedural Dx: Same  Successful placement of a new peritoneal drainage catheter via left lower abdominal approach. Successful bedside removal of existing peritoneal drainage catheter  Approximately 2.5 L cc of slightly chylous appearing ascitic fluid aspirated after catheter placement.  EBL: None  Complications: None immediate.  Ronny Bacon, MD Pager #: [phone]

## 2017-07-26 ENCOUNTER — Encounter: Payer: Medicare Other | Admitting: Family Medicine

## 2017-07-26 ENCOUNTER — Encounter: Payer: Self-pay | Admitting: Family Medicine

## 2017-07-26 ENCOUNTER — Ambulatory Visit (INDEPENDENT_AMBULATORY_CARE_PROVIDER_SITE_OTHER): Payer: Medicare Other | Admitting: Family Medicine

## 2017-07-26 VITALS — BP 152/81 | HR 95 | Temp 98.2°F | Wt 174.0 lb

## 2017-07-26 DIAGNOSIS — H608X3 Other otitis externa, bilateral: Secondary | ICD-10-CM

## 2017-07-26 DIAGNOSIS — H6123 Impacted cerumen, bilateral: Secondary | ICD-10-CM | POA: Diagnosis not present

## 2017-07-26 MED ORDER — FLUOCINOLONE ACETONIDE 0.01 % OT OIL: 1.0000 [drp] | TOPICAL_OIL | Freq: Every day | OTIC | 0 refills | Status: AC | PRN

## 2017-07-26 NOTE — Progress Notes (Signed)
   BP (!) 152/81   Pulse 95   Temp 98.2 F (36.8 C)   Wt 174 lb (78.9 kg)   SpO2 97%   BMI 26.46 kg/m    Subjective:    Patient ID: Richard Whitehead, male    DOB: [birth year], 71 y.o.   MRN: [id number]  HPI: Richard Whitehead is a 71 y.o. male  Chief Complaint  Patient presents with  . Cerumen Impaction    bilateral ears   Pt here with b/l ear fullness and itching/irritation. Hx of cerumen impaction, especially since initiation of hearing aids. Has been trying to keep them clean but not having success. Denies congestion, fevers, sharp ear pain.   Relevant past medical, surgical, family and social history reviewed and updated as indicated. Interim medical history since our last visit reviewed. Allergies and medications reviewed and updated.  Review of Systems  Constitutional: Negative.   HENT: Positive for ear pain (fullness/pressure) and hearing loss.   Respiratory: Negative.   Cardiovascular: Negative.   Musculoskeletal: Negative.   Neurological: Negative.   Psychiatric/Behavioral: Negative.    Per HPI unless specifically indicated above     Objective:    BP (!) 152/81   Pulse 95   Temp 98.2 F (36.8 C)   Wt 174 lb (78.9 kg)   SpO2 97%   BMI 26.46 kg/m   Wt Readings from Last 3 Encounters:  07/26/17 174 lb (78.9 kg)  05/31/17 153 lb 9.6 oz (69.7 kg)  05/03/17 161 lb (73 kg)    Physical Exam  Constitutional: He appears well-developed and well-nourished. No distress.  HENT:  Head: Atraumatic.  B/l EACs impacted with some wax but mostly dry, peeling skin debris  Eyes: Pupils are equal, round, and reactive to light. Conjunctivae are normal.  Neck: Normal range of motion. Neck supple.  Cardiovascular: Normal rate and normal heart sounds.   Pulmonary/Chest: Effort normal and breath sounds normal.  Musculoskeletal: Normal range of motion.  Neurological: He is alert.  Skin: Skin is warm and dry.  Psychiatric: He has a normal mood and affect. His behavior is normal.    Nursing note and vitals reviewed.  Procedure: B/l ear lavage Procedure explained and questions answered adequately. Both ears carefully lavaged with warm water and peroxide combination with minimal success. Curette used with some success to remove a significant amount of flaking dried skin and debris from EACs. Pt tolerated procedure well with no immediate complications noted.      Assessment & Plan:   Problem List Items Addressed This Visit    None    Visit Diagnoses    Bilateral impacted cerumen    -  Primary   Some success with lavage and curettage. Reviewed use of debrox drops several times weekly for maintenance   Chronic eczematous otitis externa of both ears       Significant dry, peeling skin in EACs. Use fluocinolone oil prn to help reduce debris from building up and relieve any itching and irritation.       Follow up plan: Return for as scheduled.

## 2017-07-29 NOTE — Patient Instructions (Signed)
Follow up as needed

## 2017-08-01 DIAGNOSIS — H2511 Age-related nuclear cataract, right eye: Secondary | ICD-10-CM | POA: Diagnosis not present

## 2017-08-01 DIAGNOSIS — H25811 Combined forms of age-related cataract, right eye: Secondary | ICD-10-CM | POA: Diagnosis not present

## 2017-08-07 ENCOUNTER — Encounter: Admitting: Family Medicine

## 2017-09-08 DIAGNOSIS — M62830 Muscle spasm of back: Secondary | ICD-10-CM | POA: Diagnosis not present

## 2017-09-08 DIAGNOSIS — M5412 Radiculopathy, cervical region: Secondary | ICD-10-CM | POA: Diagnosis not present

## 2017-09-08 DIAGNOSIS — M9903 Segmental and somatic dysfunction of lumbar region: Secondary | ICD-10-CM | POA: Diagnosis not present

## 2017-09-08 DIAGNOSIS — M9901 Segmental and somatic dysfunction of cervical region: Secondary | ICD-10-CM | POA: Diagnosis not present

## 2017-09-08 DIAGNOSIS — M9902 Segmental and somatic dysfunction of thoracic region: Secondary | ICD-10-CM | POA: Diagnosis not present

## 2017-09-08 DIAGNOSIS — M5416 Radiculopathy, lumbar region: Secondary | ICD-10-CM | POA: Diagnosis not present

## 2017-09-10 DIAGNOSIS — M9901 Segmental and somatic dysfunction of cervical region: Secondary | ICD-10-CM | POA: Diagnosis not present

## 2017-09-10 DIAGNOSIS — M5412 Radiculopathy, cervical region: Secondary | ICD-10-CM | POA: Diagnosis not present

## 2017-09-10 DIAGNOSIS — M9902 Segmental and somatic dysfunction of thoracic region: Secondary | ICD-10-CM | POA: Diagnosis not present

## 2017-09-10 DIAGNOSIS — M62830 Muscle spasm of back: Secondary | ICD-10-CM | POA: Diagnosis not present

## 2017-09-12 DIAGNOSIS — M9903 Segmental and somatic dysfunction of lumbar region: Secondary | ICD-10-CM | POA: Diagnosis not present

## 2017-09-12 DIAGNOSIS — M5416 Radiculopathy, lumbar region: Secondary | ICD-10-CM | POA: Diagnosis not present

## 2017-09-12 DIAGNOSIS — M62830 Muscle spasm of back: Secondary | ICD-10-CM | POA: Diagnosis not present

## 2017-09-12 DIAGNOSIS — M9902 Segmental and somatic dysfunction of thoracic region: Secondary | ICD-10-CM | POA: Diagnosis not present

## 2017-09-14 DIAGNOSIS — M9903 Segmental and somatic dysfunction of lumbar region: Secondary | ICD-10-CM | POA: Diagnosis not present

## 2017-09-14 DIAGNOSIS — M62830 Muscle spasm of back: Secondary | ICD-10-CM | POA: Diagnosis not present

## 2017-09-14 DIAGNOSIS — M9902 Segmental and somatic dysfunction of thoracic region: Secondary | ICD-10-CM | POA: Diagnosis not present

## 2017-09-14 DIAGNOSIS — M5416 Radiculopathy, lumbar region: Secondary | ICD-10-CM | POA: Diagnosis not present

## 2017-09-19 ENCOUNTER — Telehealth: Payer: Self-pay | Admitting: Family Medicine

## 2017-09-19 ENCOUNTER — Ambulatory Visit (INDEPENDENT_AMBULATORY_CARE_PROVIDER_SITE_OTHER): Payer: Medicare Other | Admitting: Family Medicine

## 2017-09-19 ENCOUNTER — Encounter: Payer: Self-pay | Admitting: Family Medicine

## 2017-09-19 VITALS — Wt 174.0 lb

## 2017-09-19 DIAGNOSIS — M9902 Segmental and somatic dysfunction of thoracic region: Secondary | ICD-10-CM | POA: Diagnosis not present

## 2017-09-19 DIAGNOSIS — S5012XA Contusion of left forearm, initial encounter: Secondary | ICD-10-CM | POA: Diagnosis not present

## 2017-09-19 DIAGNOSIS — M9903 Segmental and somatic dysfunction of lumbar region: Secondary | ICD-10-CM | POA: Diagnosis not present

## 2017-09-19 DIAGNOSIS — M5416 Radiculopathy, lumbar region: Secondary | ICD-10-CM | POA: Diagnosis not present

## 2017-09-19 DIAGNOSIS — H6123 Impacted cerumen, bilateral: Secondary | ICD-10-CM

## 2017-09-19 DIAGNOSIS — T148XXA Other injury of unspecified body region, initial encounter: Secondary | ICD-10-CM | POA: Diagnosis not present

## 2017-09-19 DIAGNOSIS — M62830 Muscle spasm of back: Secondary | ICD-10-CM | POA: Diagnosis not present

## 2017-09-19 MED ORDER — SILVER SULFADIAZINE 1 % EX CREA: 1.0000 "application " | TOPICAL_CREAM | Freq: Every day | CUTANEOUS | 1 refills | Status: AC

## 2017-09-19 NOTE — Progress Notes (Addendum)
   Wt 174 lb (78.9 kg)   BMI 26.46 kg/m    Subjective:    Patient ID: Richard Whitehead, male    DOB: November 14, 1945, 71 y.o.   MRN: [id number]  HPI: Richard Whitehead is a 71 y.o. male  Chief Complaint  Patient presents with  . Blister    Blood. Left Arm. Noticed x 3 weeks, after fall.    Patient  Bruised his left medial aspect of his arm adjacent to his elbow  3 weeks ago during a fall has remained a blood blister sincehas thought it would go ahead and drain but it hasn't and has become more problematic. His been dressed by nursing and showed some signs of drainage today. Patient also bothered by stopped up ears requesting that his ears be washed out. Review patient's complicated medical condition. Patient stable but having marked chills due to the cold gave Tylenol turned the heat up and lots of blankets.. This resolved after about 10 minutes. Relevant past medical, surgical, family and social history reviewed and updated as indicated. Interim medical history since our last visit reviewed. Allergies and medications reviewed and updated.  Review of Systems  Constitutional: Negative.   Respiratory: Negative.   Cardiovascular: Negative.     Per HPI unless specifically indicated above     Objective:    Wt 174 lb (78.9 kg)   BMI 26.46 kg/m   Wt Readings from Last 3 Encounters:  09/19/17 174 lb (78.9 kg)  07/26/17 174 lb (78.9 kg)  05/31/17 153 lb 9.6 oz (69.7 kg)    Physical Exam  Constitutional: He is oriented to person, place, and time. He appears well-developed and well-nourished.  HENT:  Head: Normocephalic and atraumatic.  Ears blocked with cerumen removed with water and instruments bilaterally  Revealing normal canal and TM with relief of stopped up sensation and  Improved hearing. Patient tolerated the procedure well  Eyes: Conjunctivae and EOM are normal.  Neck: Normal range of motion.  Cardiovascular: Normal rate, regular rhythm and normal heart sounds.  Pulmonary/Chest:  Effort normal and breath sounds normal.  Musculoskeletal: Normal range of motion.  Neurological: He is alert and oriented to person, place, and time.  Skin: No erythema.  large hematoma area was debrided with scissors and forceps large amount of drainage there is some consolidation of blood under the skin which is continuing to drain. Patient education to wife on wound care and will use Silvadene cream.  Psychiatric: He has a normal mood and affect. His behavior is normal. Judgment and thought content normal.    Results for orders placed or performed in visit on 06/21/17  Cologuard  Result Value Ref Range   Cologuard Negative       Assessment & Plan:   Problem List Items Addressed This Visit      Other   Hematoma and contusion    Debridement of hematoma done left arm       Other Visit Diagnoses    Hearing loss due to cerumen impaction, bilateral    -  Primary     Cerumen impaction withremoval of instruments and water Wound debridement of large hematoma left arm.  Follow up plan: Return if symptoms worsen or fail to improve, for As scheduled.

## 2017-09-19 NOTE — Telephone Encounter (Signed)
rec'd phone call from pharmacist at [facility].  Requested clarification on Rx for Silvadene cream.  Advised that Dr. Jeananne Rama ordered "Silver Sulfadiazine 1 % cream; apply 1 application topically daily, 50 gram; RF x 1"  Marcello Moores, RPh verb. Understanding.

## 2017-09-21 DIAGNOSIS — M9903 Segmental and somatic dysfunction of lumbar region: Secondary | ICD-10-CM | POA: Diagnosis not present

## 2017-09-21 DIAGNOSIS — M9902 Segmental and somatic dysfunction of thoracic region: Secondary | ICD-10-CM | POA: Diagnosis not present

## 2017-09-21 DIAGNOSIS — M62830 Muscle spasm of back: Secondary | ICD-10-CM | POA: Diagnosis not present

## 2017-09-21 DIAGNOSIS — M5416 Radiculopathy, lumbar region: Secondary | ICD-10-CM | POA: Diagnosis not present

## 2017-09-24 DIAGNOSIS — M9902 Segmental and somatic dysfunction of thoracic region: Secondary | ICD-10-CM | POA: Diagnosis not present

## 2017-09-24 DIAGNOSIS — M5416 Radiculopathy, lumbar region: Secondary | ICD-10-CM | POA: Diagnosis not present

## 2017-09-24 DIAGNOSIS — M9903 Segmental and somatic dysfunction of lumbar region: Secondary | ICD-10-CM | POA: Diagnosis not present

## 2017-09-24 DIAGNOSIS — T148XXA Other injury of unspecified body region, initial encounter: Secondary | ICD-10-CM | POA: Insufficient documentation

## 2017-09-24 DIAGNOSIS — M62830 Muscle spasm of back: Secondary | ICD-10-CM | POA: Diagnosis not present

## 2017-09-24 NOTE — Assessment & Plan Note (Signed)
Debridement of hematoma done left arm

## 2017-09-28 DIAGNOSIS — M9902 Segmental and somatic dysfunction of thoracic region: Secondary | ICD-10-CM | POA: Diagnosis not present

## 2017-09-28 DIAGNOSIS — M5416 Radiculopathy, lumbar region: Secondary | ICD-10-CM | POA: Diagnosis not present

## 2017-09-28 DIAGNOSIS — M9903 Segmental and somatic dysfunction of lumbar region: Secondary | ICD-10-CM | POA: Diagnosis not present

## 2017-09-28 DIAGNOSIS — M62830 Muscle spasm of back: Secondary | ICD-10-CM | POA: Diagnosis not present

## 2017-10-03 DIAGNOSIS — M62830 Muscle spasm of back: Secondary | ICD-10-CM | POA: Diagnosis not present

## 2017-10-03 DIAGNOSIS — M9903 Segmental and somatic dysfunction of lumbar region: Secondary | ICD-10-CM | POA: Diagnosis not present

## 2017-10-03 DIAGNOSIS — M9902 Segmental and somatic dysfunction of thoracic region: Secondary | ICD-10-CM | POA: Diagnosis not present

## 2017-10-03 DIAGNOSIS — M5416 Radiculopathy, lumbar region: Secondary | ICD-10-CM | POA: Diagnosis not present

## 2017-10-09 DEATH — deceased

## 2017-10-15 ENCOUNTER — Telehealth: Payer: Self-pay | Admitting: Gastroenterology

## 2017-10-15 NOTE — Telephone Encounter (Signed)
I do not recall particularly if there was an "interim report", but there is nothing currently in my inbox, so anything I received has been signed and returned.

## 2017-10-15 NOTE — Telephone Encounter (Signed)
Called Kelly at hospice, they have not received the report back. She will refax it to our office.

## 2017-10-15 NOTE — Telephone Encounter (Signed)
Routed to Dr. Danis. 

## 2018-05-10 ENCOUNTER — Encounter: Payer: Self-pay | Admitting: Family Medicine

## 2018-05-20 ENCOUNTER — Telehealth: Payer: Self-pay | Admitting: Family Medicine

## 2018-05-20 NOTE — Telephone Encounter (Signed)
Copied from [place] [phone]. Topic: General - Deceased Patient >> June 15, 2018  3:52 PM Bea Graff, NT wrote: Reason for CRM: Pts wife states he passed away on 2017/11/02. She was unaware that the office did not know.  Route to department's PEC Pool.

## 2019-01-13 IMAGING — DX DG CHEST 2V
3 series · 3 of 3 positions shown · non-contrast
Comparison: None in PACs

CLINICAL DATA: COPD, long-term smoker, questionable 30 pound weight
loss over the past 6 months

EXAM:
CHEST  2 VIEW

[chest pa (1 of 2)]
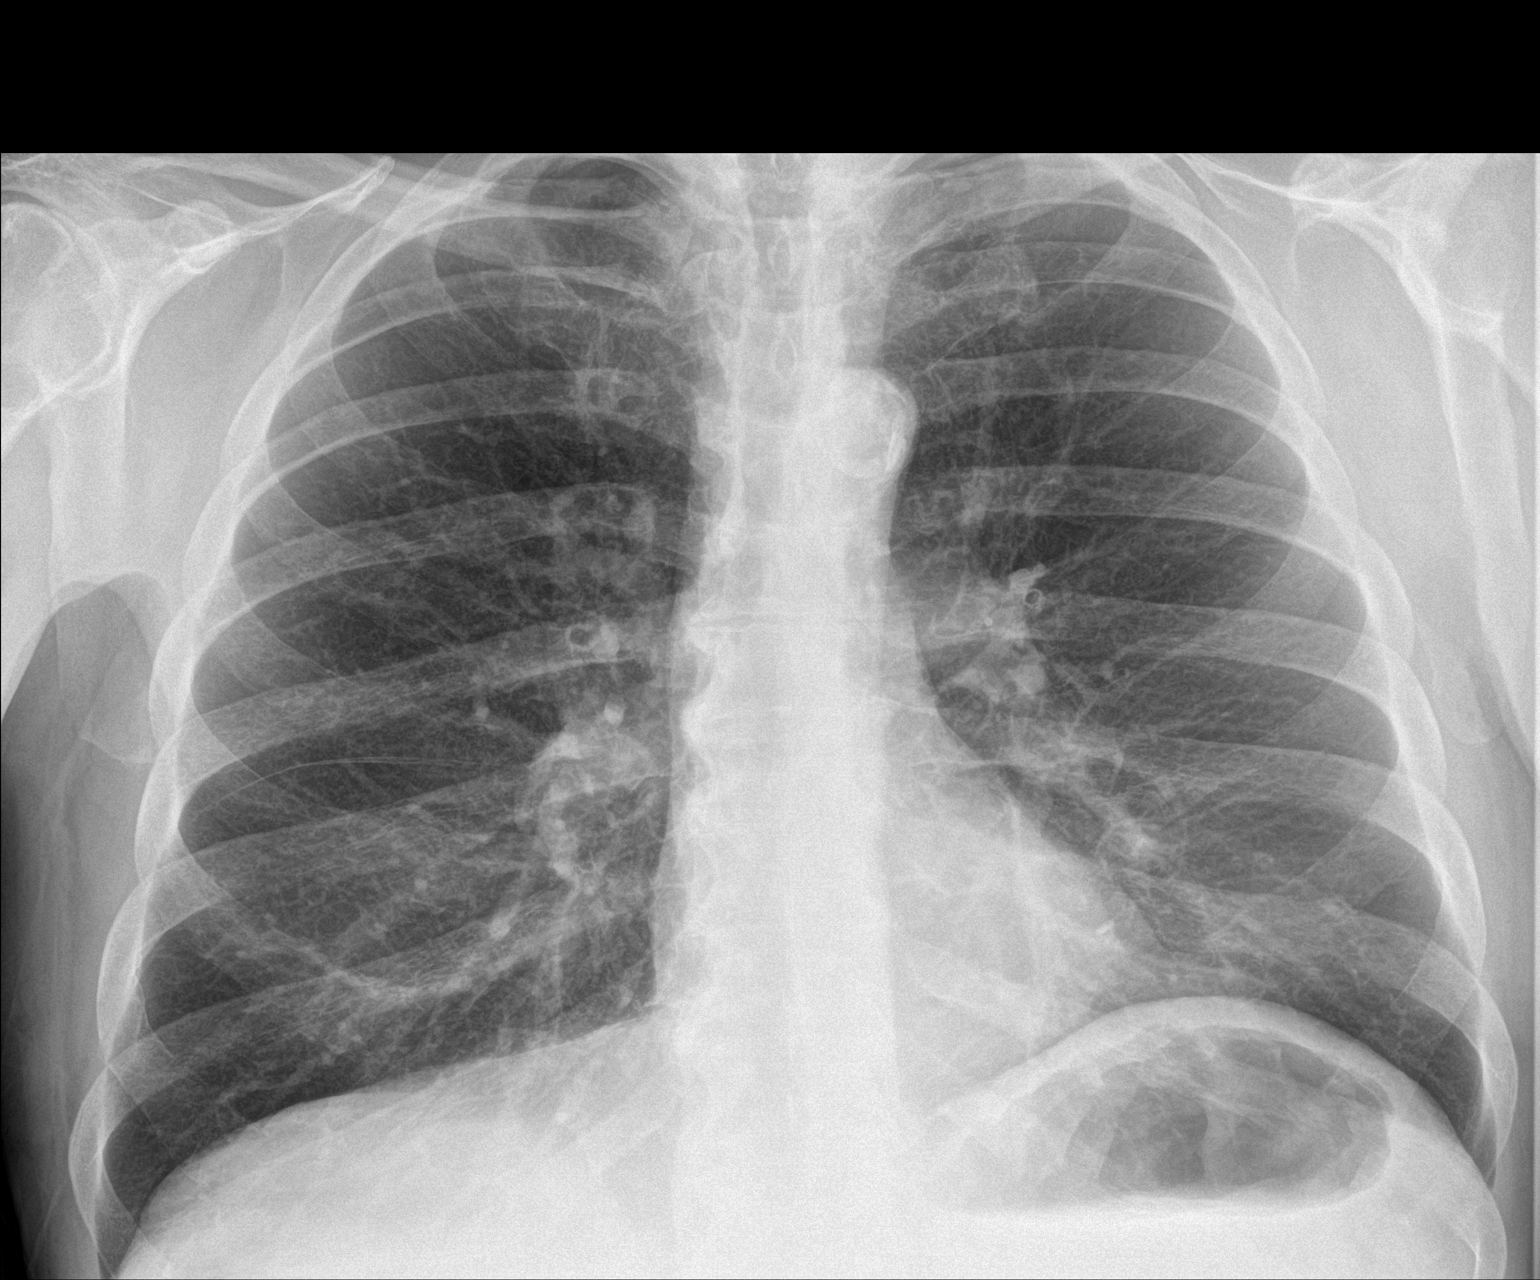

[chest pa (2 of 2)]
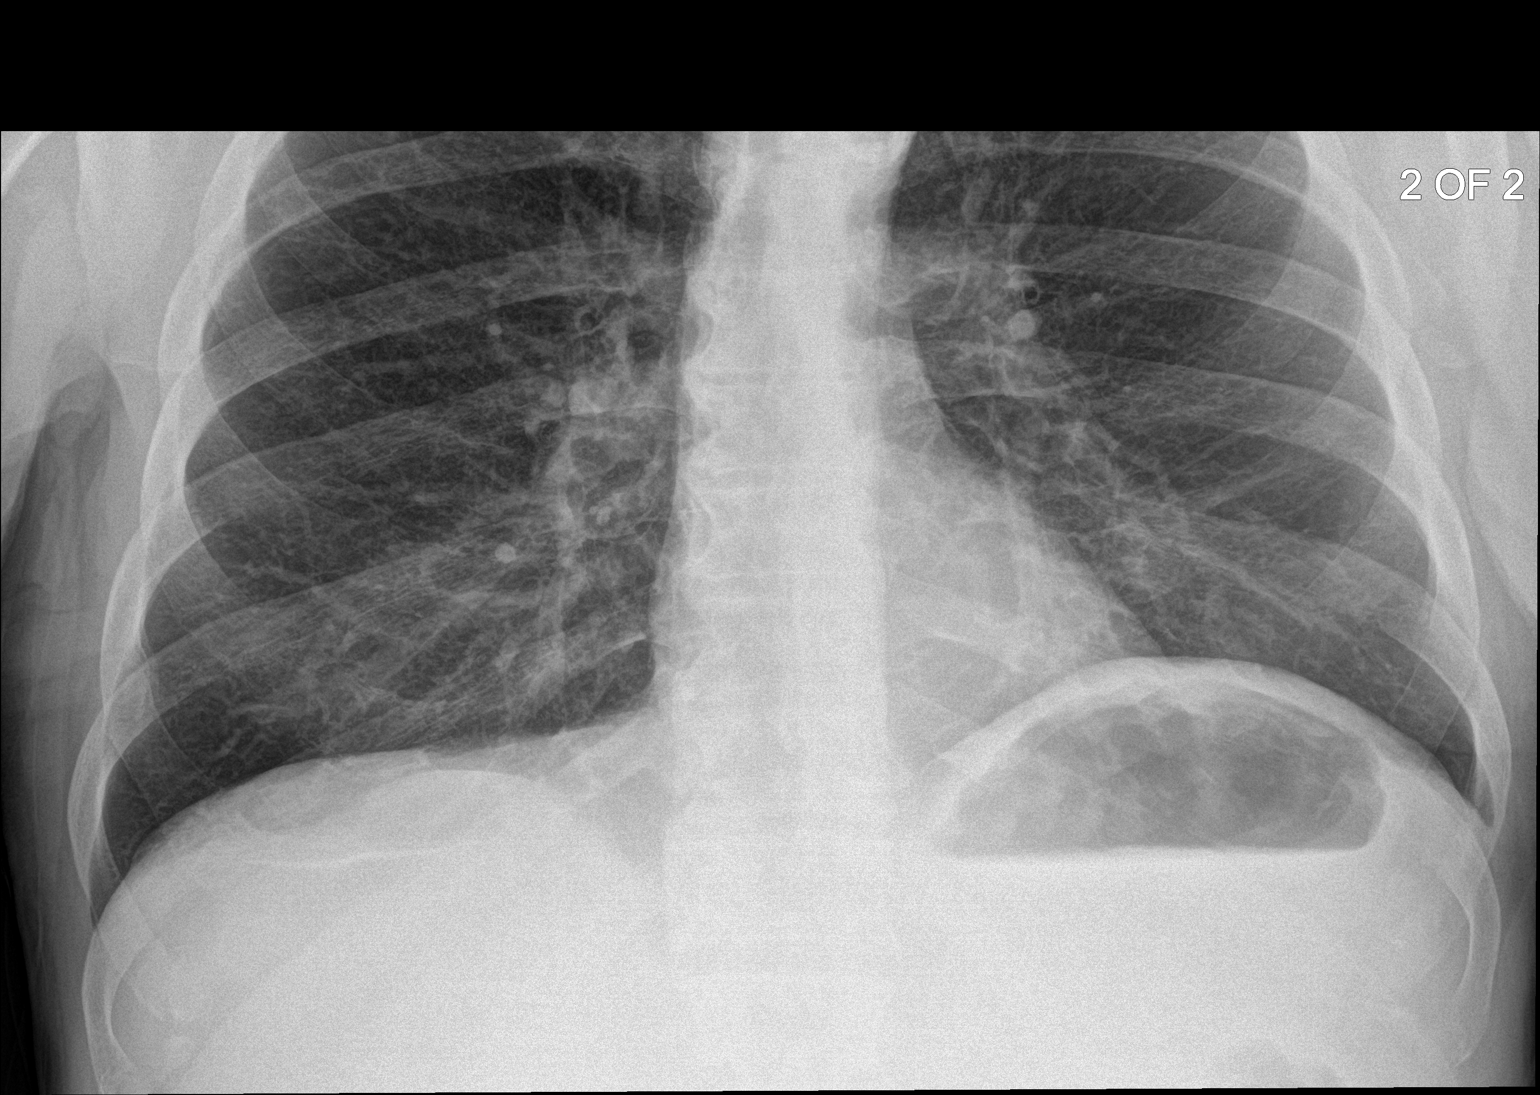

[chest lat]
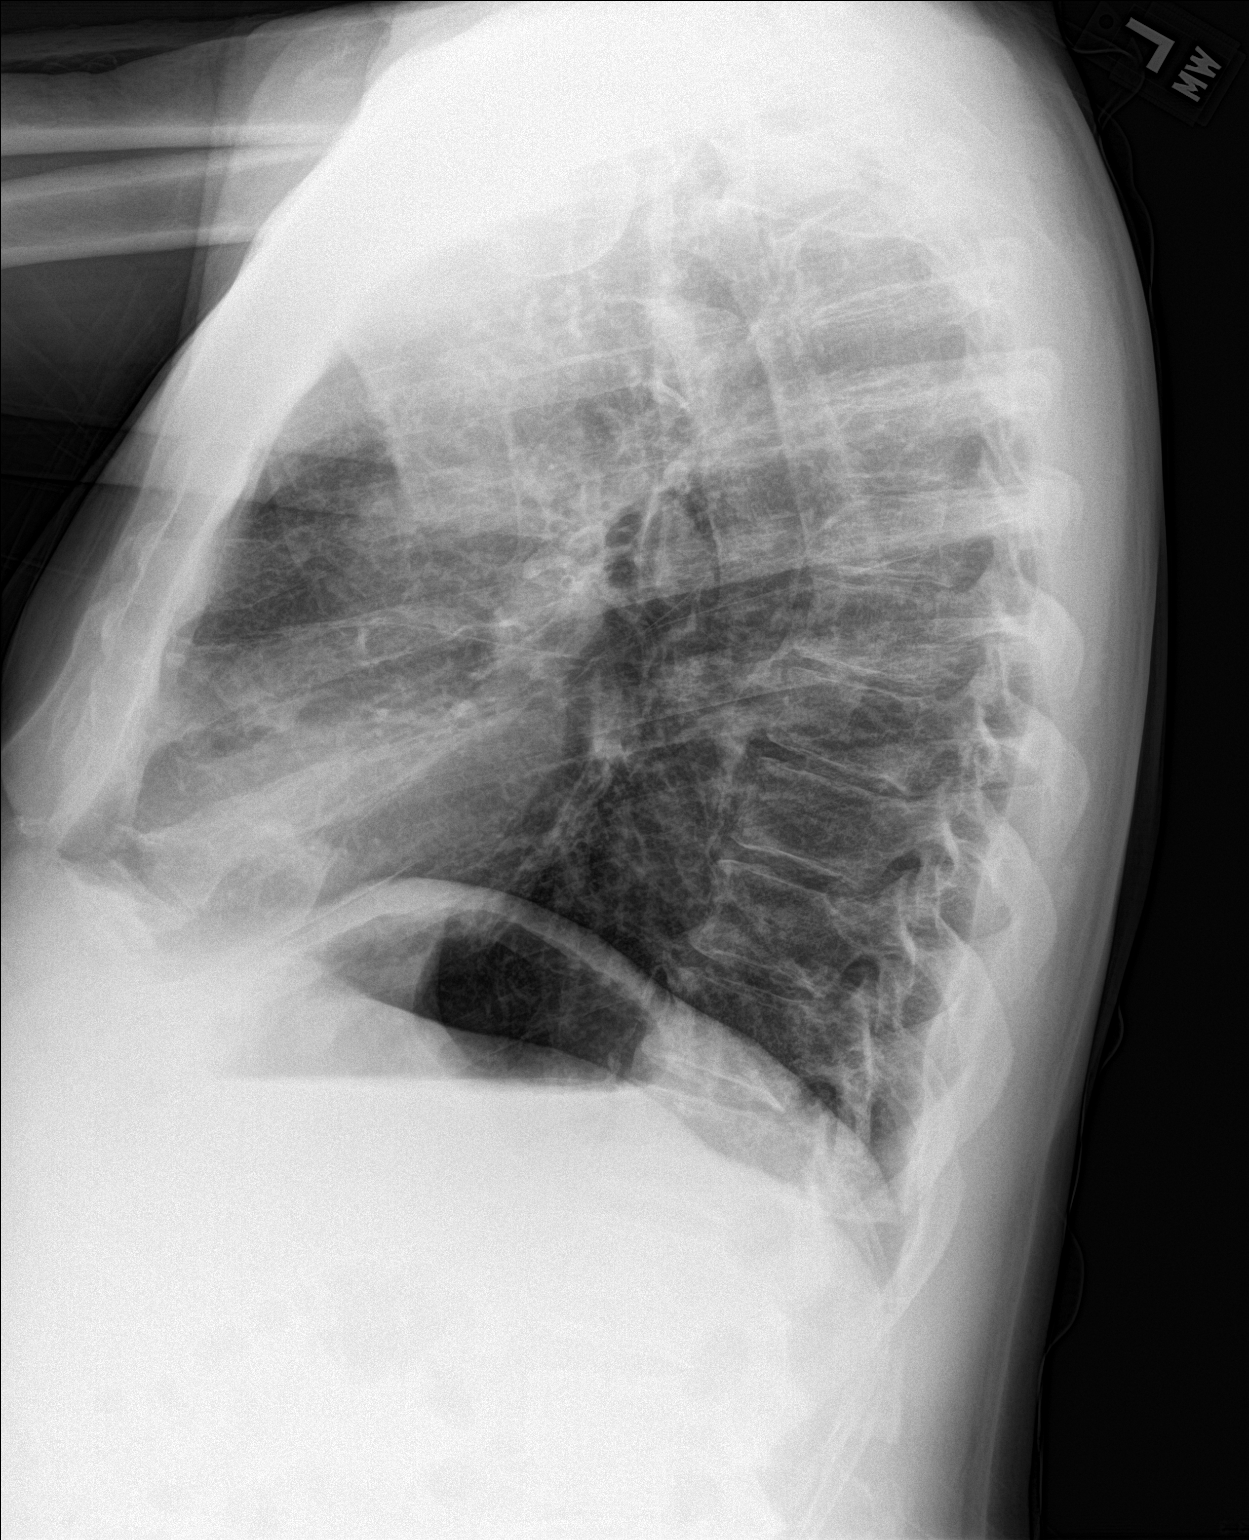

[3 of 3 positions shown; findings below may reference images not displayed]

FINDINGS: The lungs are adequately inflated. The lung markings are coarse in
the right middle lobe and lingula. There are no air bronchograms.
There are no discrete masses. There is no pleural effusion or
pneumothorax. There is dense calcification in the wall of the
thoracic aorta. The heart and pulmonary vascularity are normal. The
bony thorax exhibits no acute abnormality.
IMPRESSION: COPD. Atelectasis or fibrosis in the lingula and right middle lobe.
No discrete pneumonia. No definite pulmonary parenchymal mass.

Thoracic aortic atherosclerosis.

If the patient's weight loss remains unexplained, chest CT scanning
with contrast would be a useful next imaging step.

## 2019-05-09 IMAGING — US US PARACENTESIS
1 series · 7 of 7 positions shown · non-contrast
Comparison: none

INDICATION: Patient with cirrhosis, liver/left retroperitoneal masses, recurrent
ascites. Request is made for therapeutic paracentesis.

[Series 1: us paracentesis · 0.26mm/px · 7 of 7 slices shown]
[im 1/7]
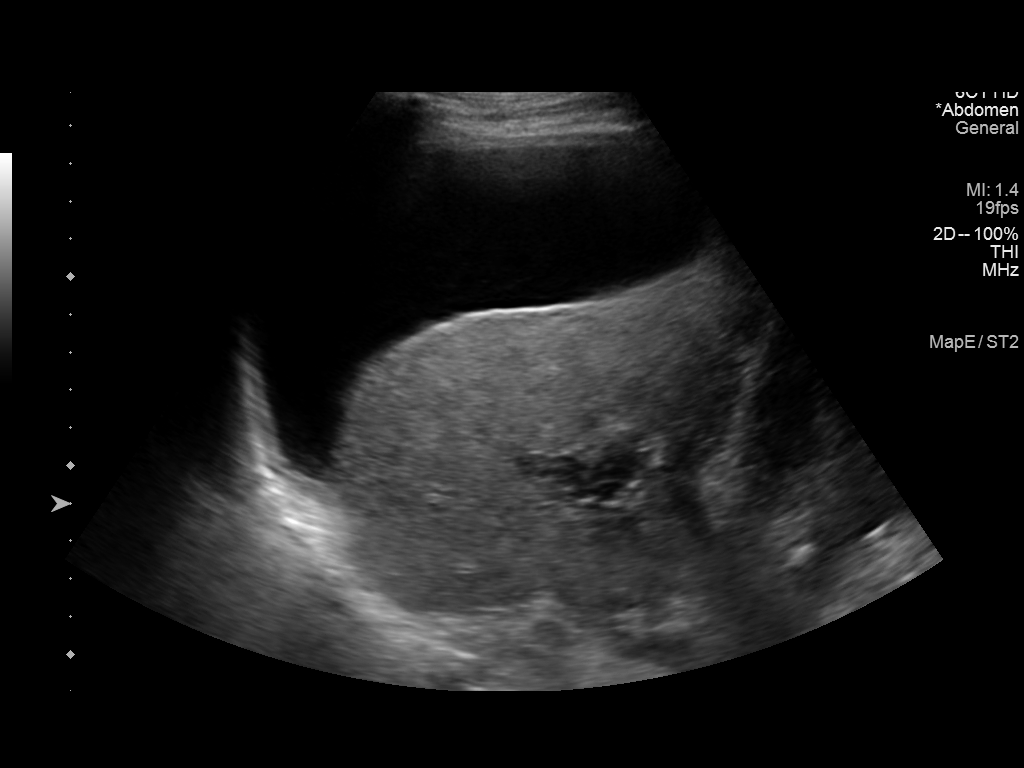
[im 2/7]
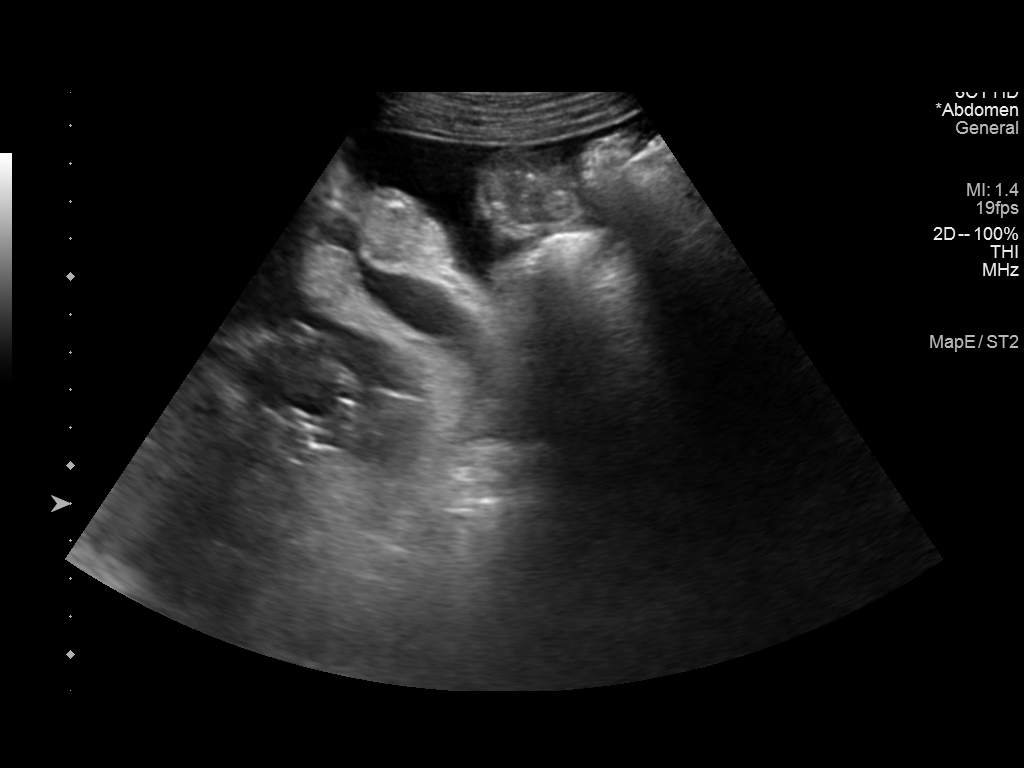
[im 3/7]
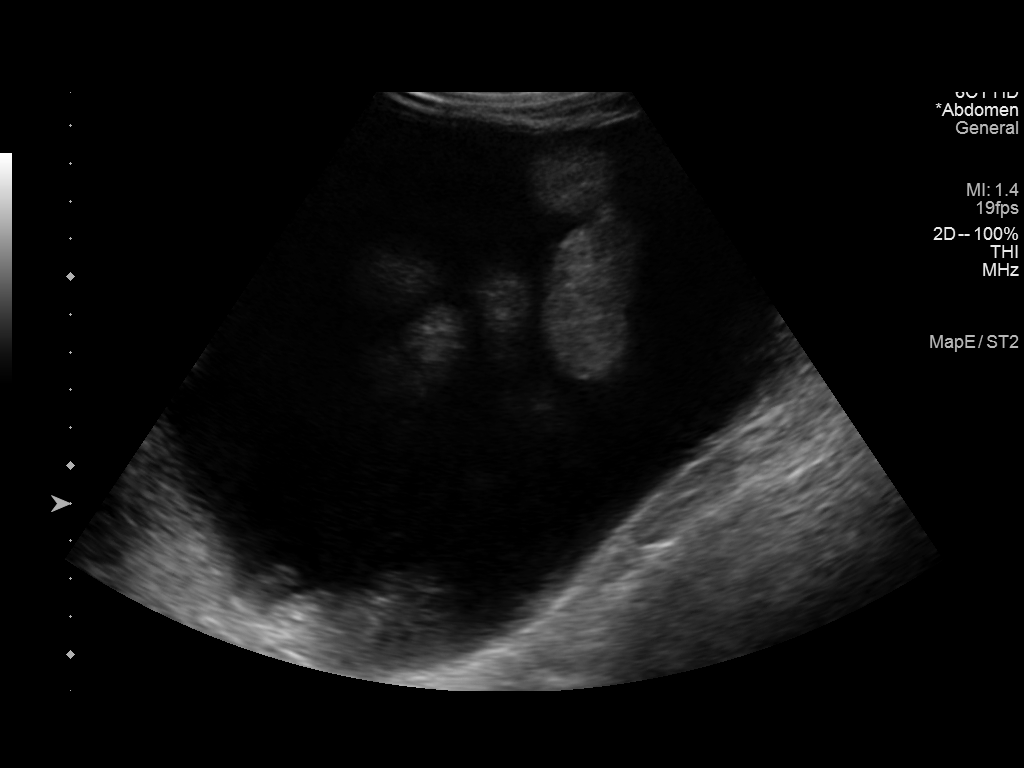
[im 4/7]
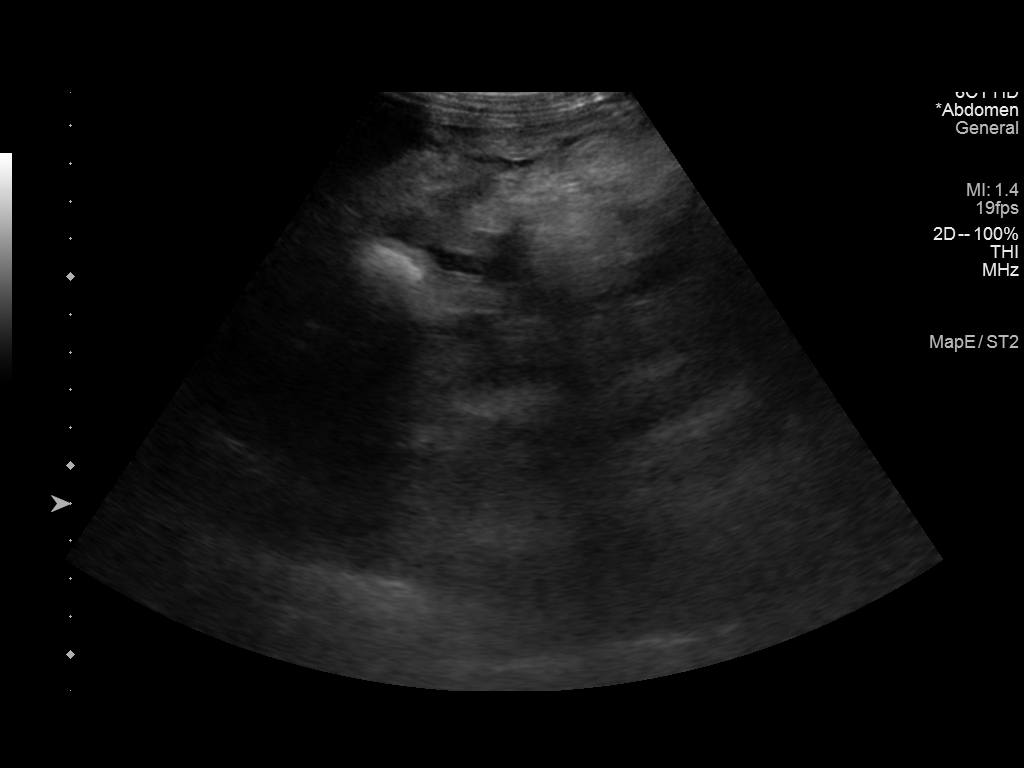
[im 5/7]
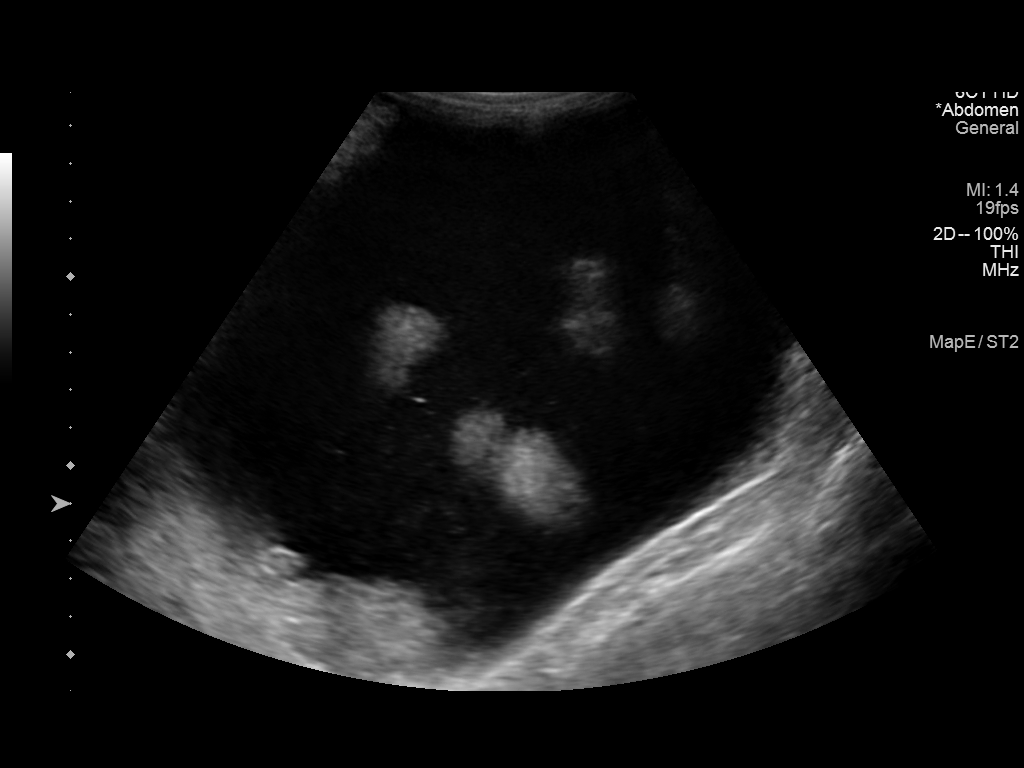
[im 6/7]
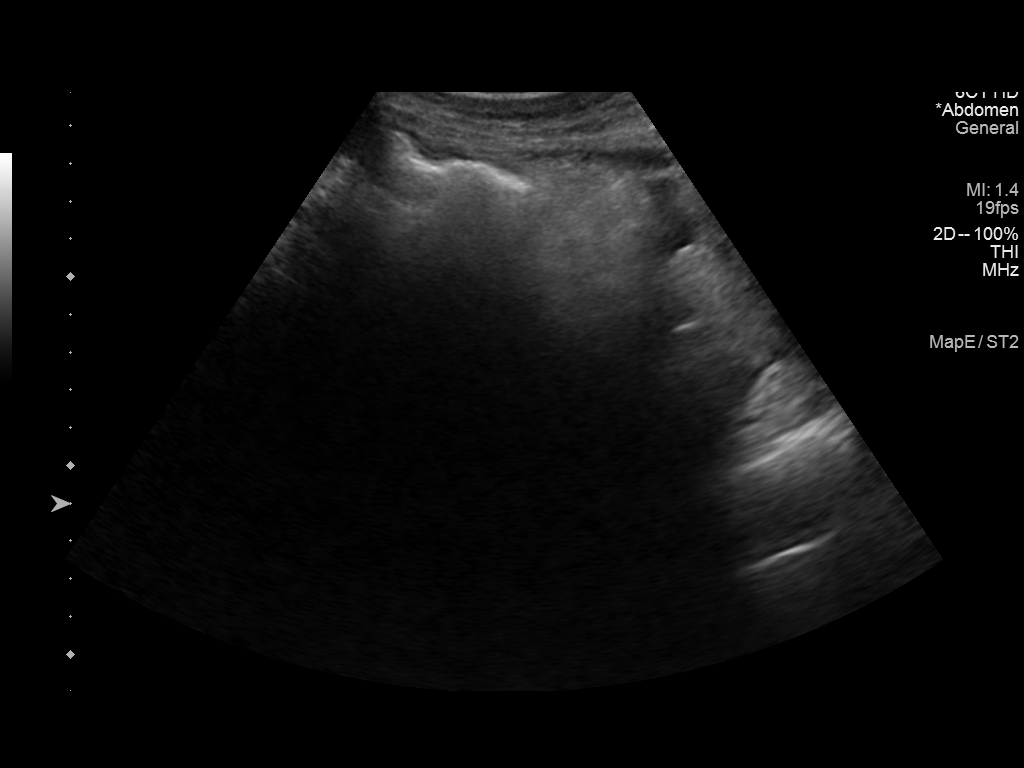
[im 7/7]
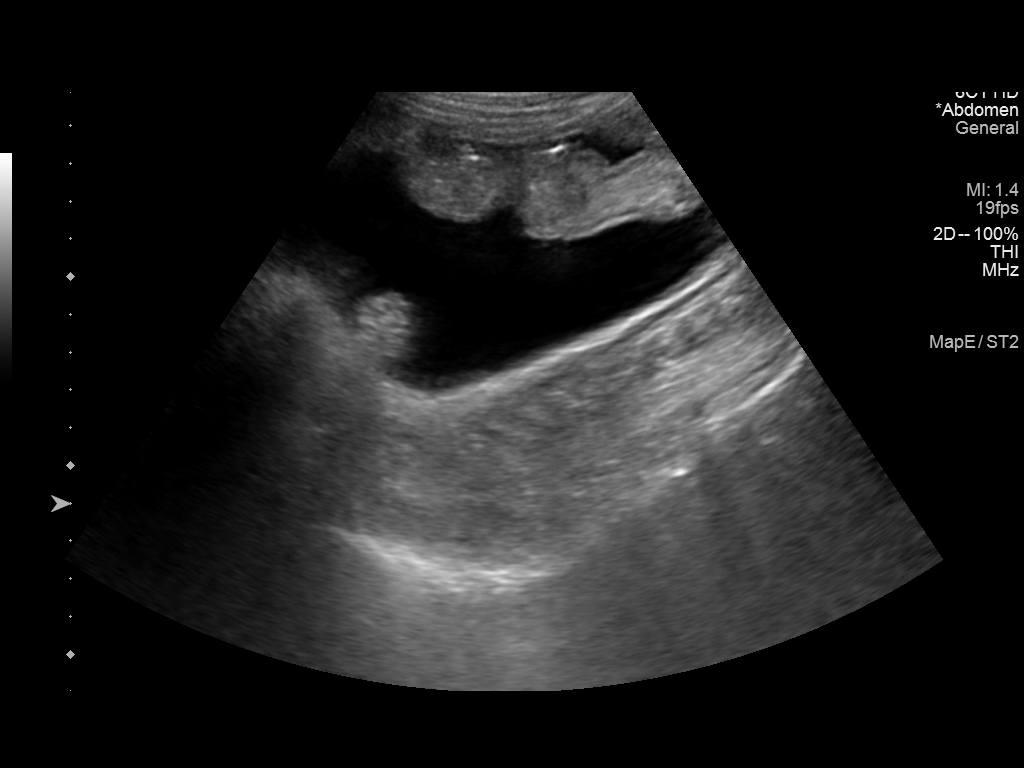

[7 of 7 positions shown; findings below may reference images not displayed]

EXAM:
ULTRASOUND GUIDED THERAPEUTIC PARACENTESIS

MEDICATIONS:
10 mL 1% lidocaine

COMPLICATIONS:
None immediate.

PROCEDURE:
Informed written consent was obtained from the patient after a
discussion of the risks, benefits and alternatives to treatment. A
timeout was performed prior to the initiation of the procedure.

Initial ultrasound scanning demonstrates a large amount of ascites
within the left lateral abdomen. The left lateral abdomen was
prepped and draped in the usual sterile fashion. 1% lidocaine was
used for local anesthesia.

Following this, a 19 gauge, 7-cm, Yueh catheter was introduced. An
ultrasound image was saved for documentation purposes. The
paracentesis was performed. The catheter was removed and a dressing
was applied. The patient tolerated the procedure well without
immediate post procedural complication.
FINDINGS: A total of approximately 5.0 liters of clear, yellow fluid was
removed. Samples were sent to the laboratory as requested by the
clinical team.
IMPRESSION: Successful ultrasound-guided therapeutic paracentesis yielding
liters of peritoneal fluid.

## 2019-06-16 IMAGING — US US PARACENTESIS
1 series · 7 of 7 positions shown · non-contrast
Comparison: none

INDICATION: Cirrhosis, recurrent ascites. Request made for therapeutic
paracentesis up to 7 liters.

[Series 1: us paracentesis · 0.26mm/px · 7 of 7 slices shown]
[im 1/7]
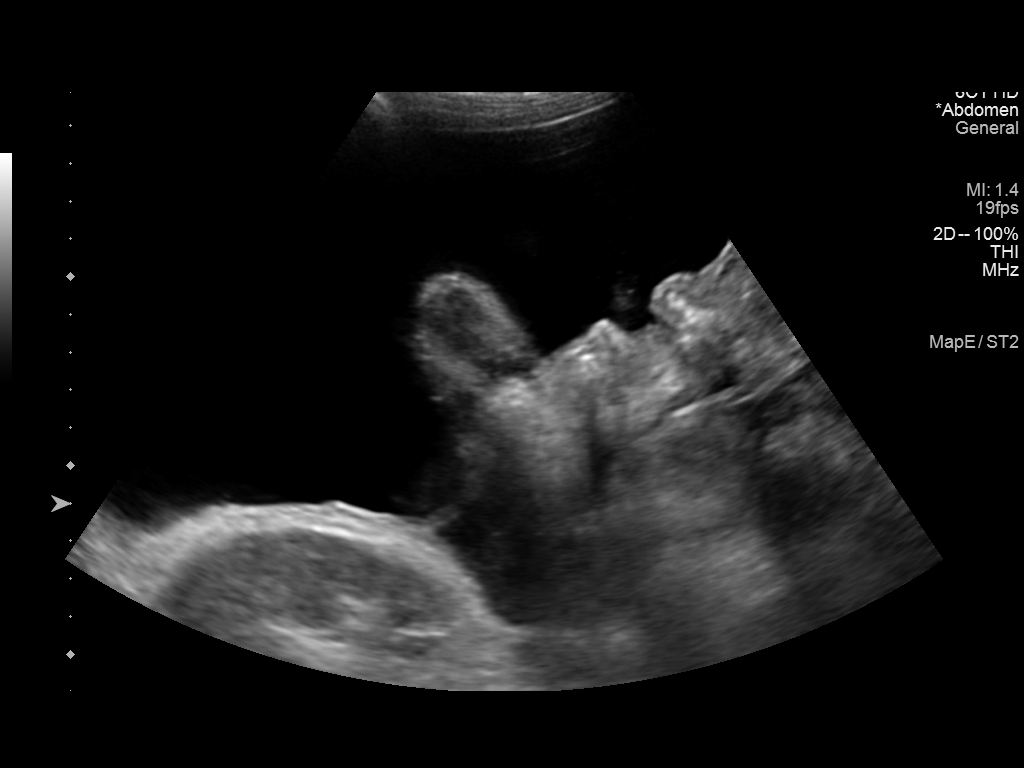
[im 2/7]
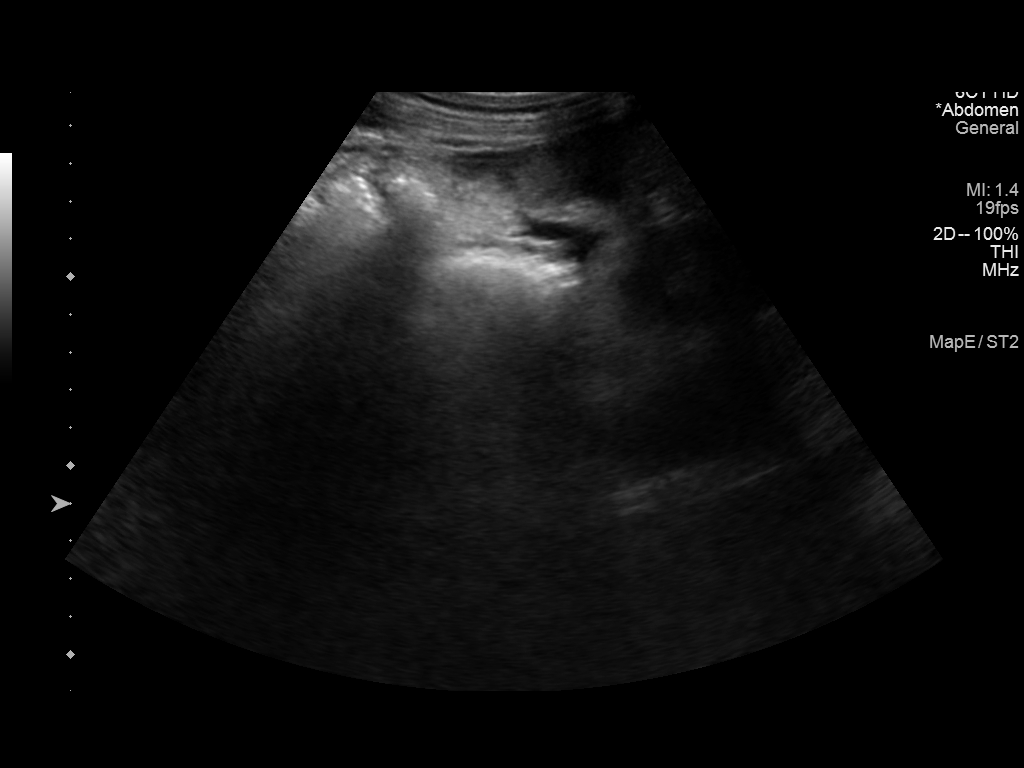
[im 3/7]
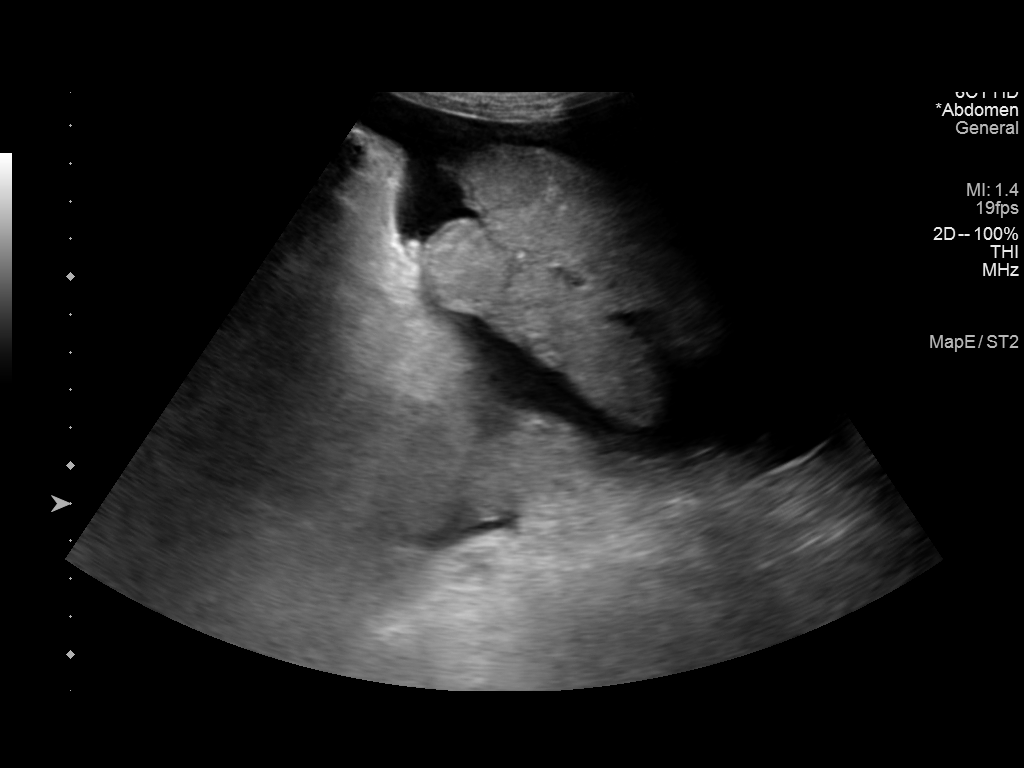
[im 4/7]
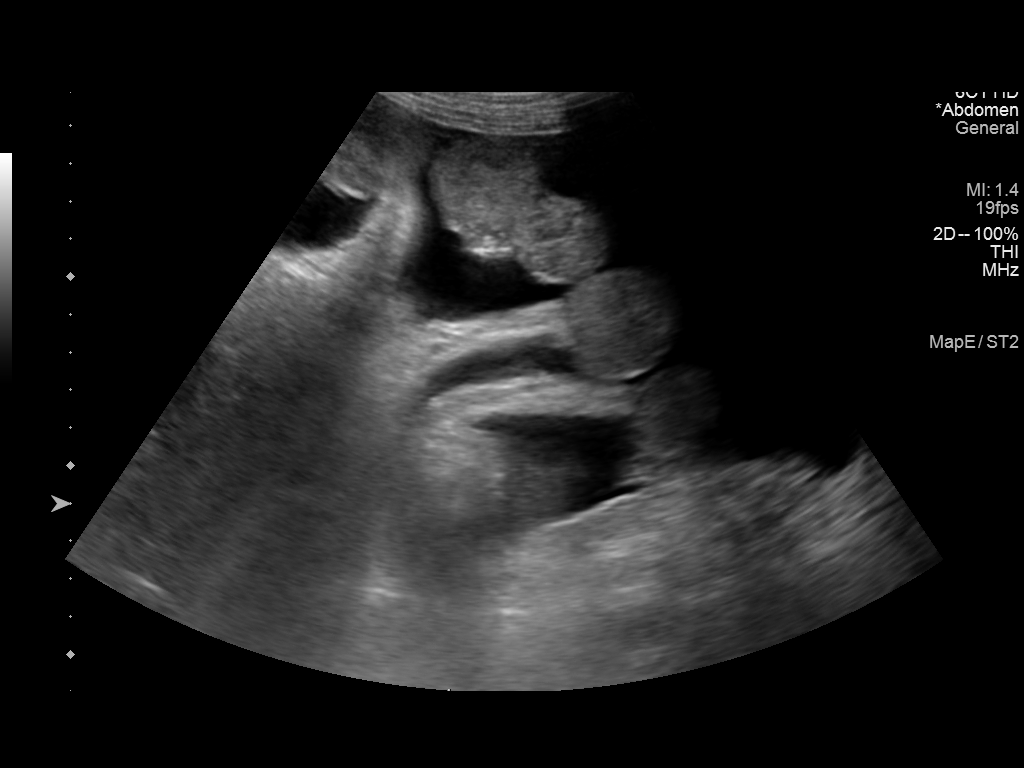
[im 5/7]
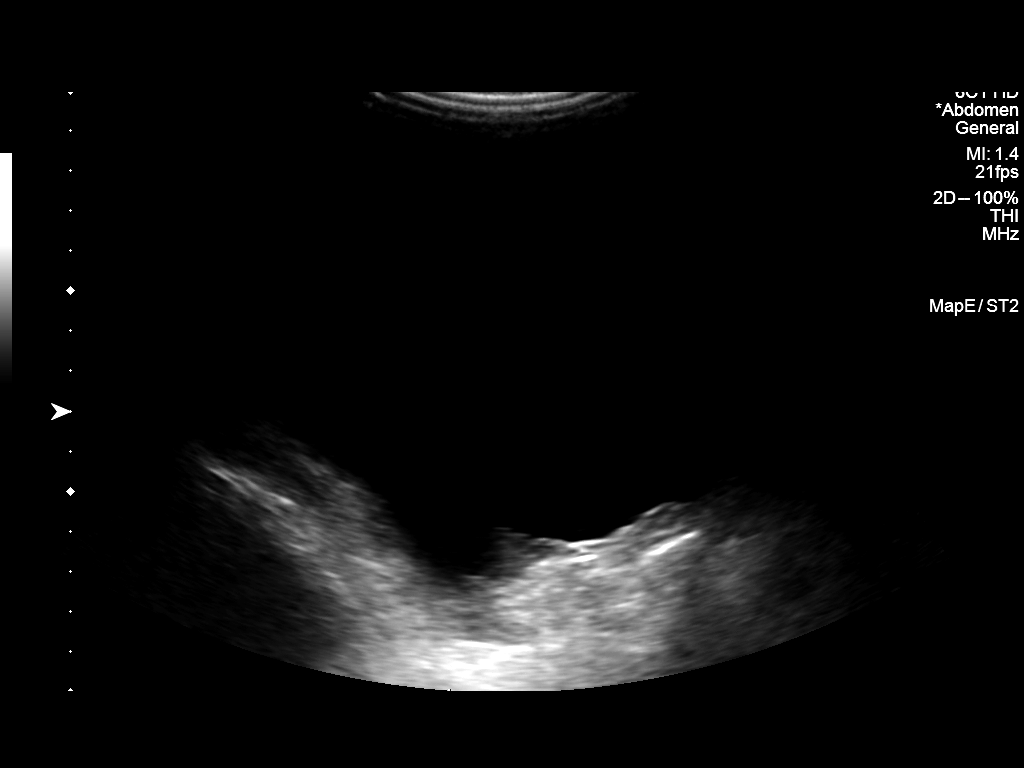
[im 6/7]
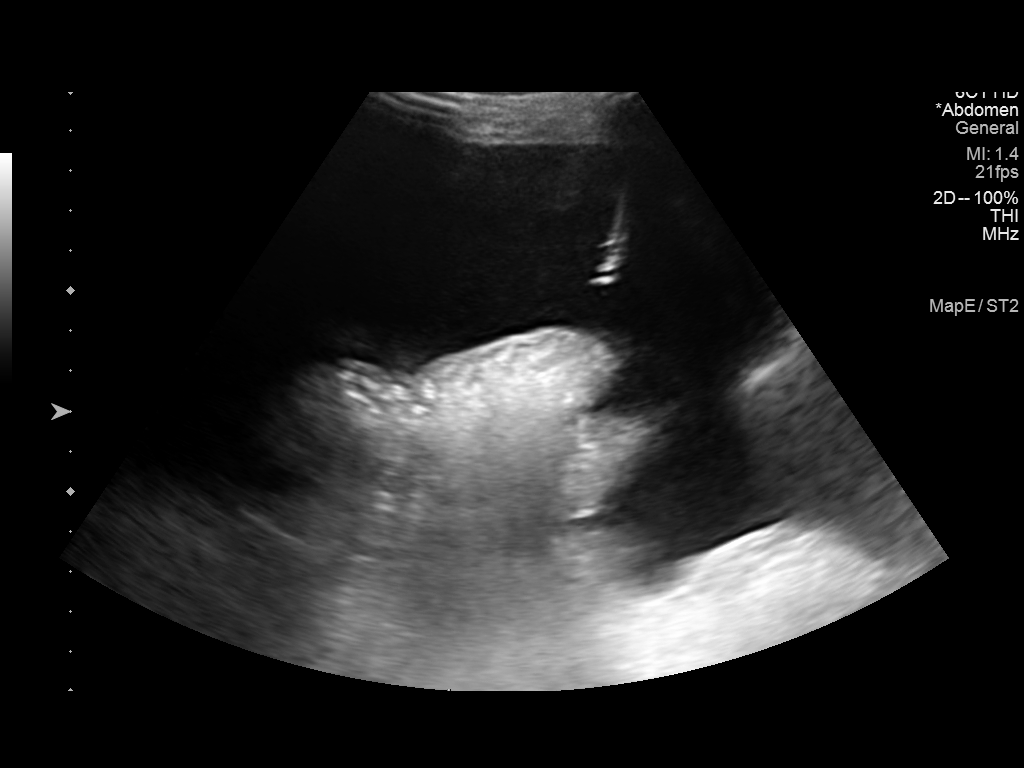
[im 7/7]
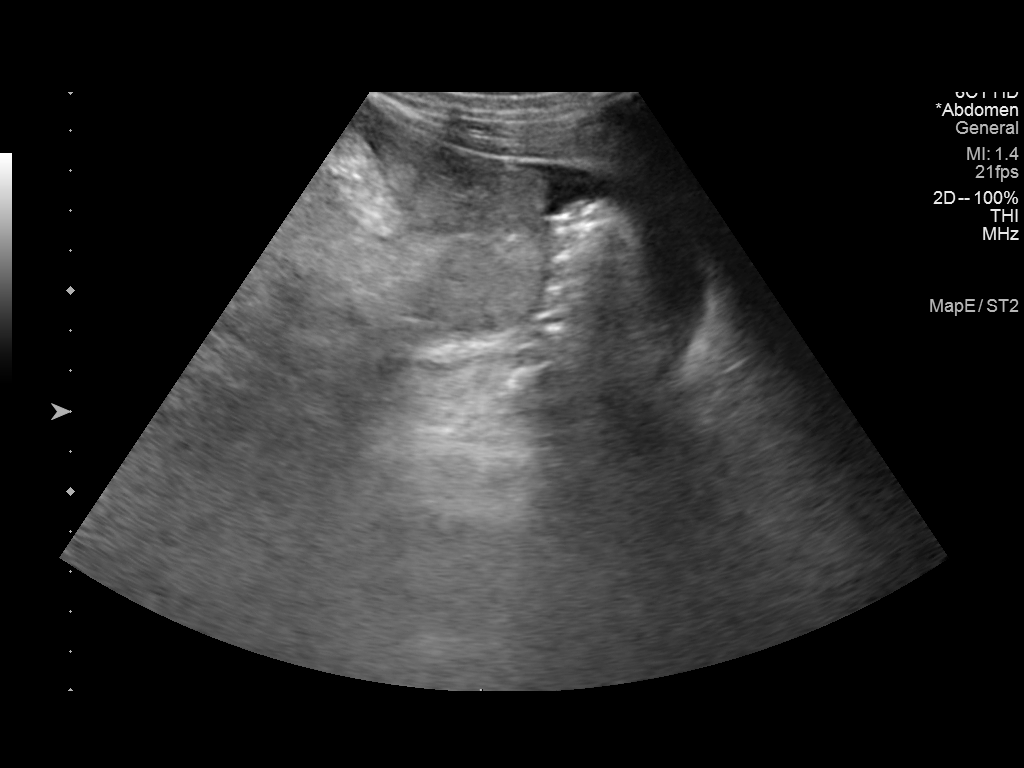

[7 of 7 positions shown; findings below may reference images not displayed]

EXAM:
ULTRASOUND GUIDED THERAPEUTIC PARACENTESIS

MEDICATIONS:
None.

COMPLICATIONS:
None immediate.

PROCEDURE:
Informed written consent was obtained from the patient after a
discussion of the risks, benefits and alternatives to treatment. A
timeout was performed prior to the initiation of the procedure.

Initial ultrasound scanning demonstrates a large amount of ascites
within the left lower abdominal quadrant. The left lower abdomen was
prepped and draped in the usual sterile fashion. 1% lidocaine was
used for local anesthesia.

Following this, a Yueh catheter was introduced. An ultrasound image
was saved for documentation purposes. The paracentesis was
performed. The catheter was removed and a dressing was applied. The
patient tolerated the procedure well without immediate post
procedural complication.
FINDINGS: A total of approximately 7 liters of hazy, yellow fluid was removed.
IMPRESSION: Successful ultrasound-guided therapeutic paracentesis yielding 7
liters of peritoneal fluid.
